# Patient Record
Sex: Female | Born: 1970 | Race: White | Hispanic: No | Marital: Married | Smoking: Never smoker
Health system: Southern US, Community
[De-identification: ages and names within clinical notes are randomized; demographics above are authoritative.]

## PROBLEM LIST (undated history)

## (undated) DIAGNOSIS — F32A Depression, unspecified: Secondary | ICD-10-CM

## (undated) DIAGNOSIS — E039 Hypothyroidism, unspecified: Secondary | ICD-10-CM

## (undated) DIAGNOSIS — F329 Major depressive disorder, single episode, unspecified: Secondary | ICD-10-CM

## (undated) DIAGNOSIS — F419 Anxiety disorder, unspecified: Secondary | ICD-10-CM

---

## 2008-06-20 ENCOUNTER — Emergency Department (HOSPITAL_BASED_OUTPATIENT_CLINIC_OR_DEPARTMENT_OTHER): Admission: EM | Admit: 2008-06-20 | Discharge: 2008-06-21 | Payer: Self-pay | Admitting: Emergency Medicine

## 2009-10-18 IMAGING — CR DG CHEST 2V
2 series · 2 of 2 positions shown · non-contrast
Comparison: None

CLINICAL DATA: Rapid heart palpitations tonight.  Brief episodes
for 1 week.  Shielded.

CHEST - 2 VIEW

[w chest pa]
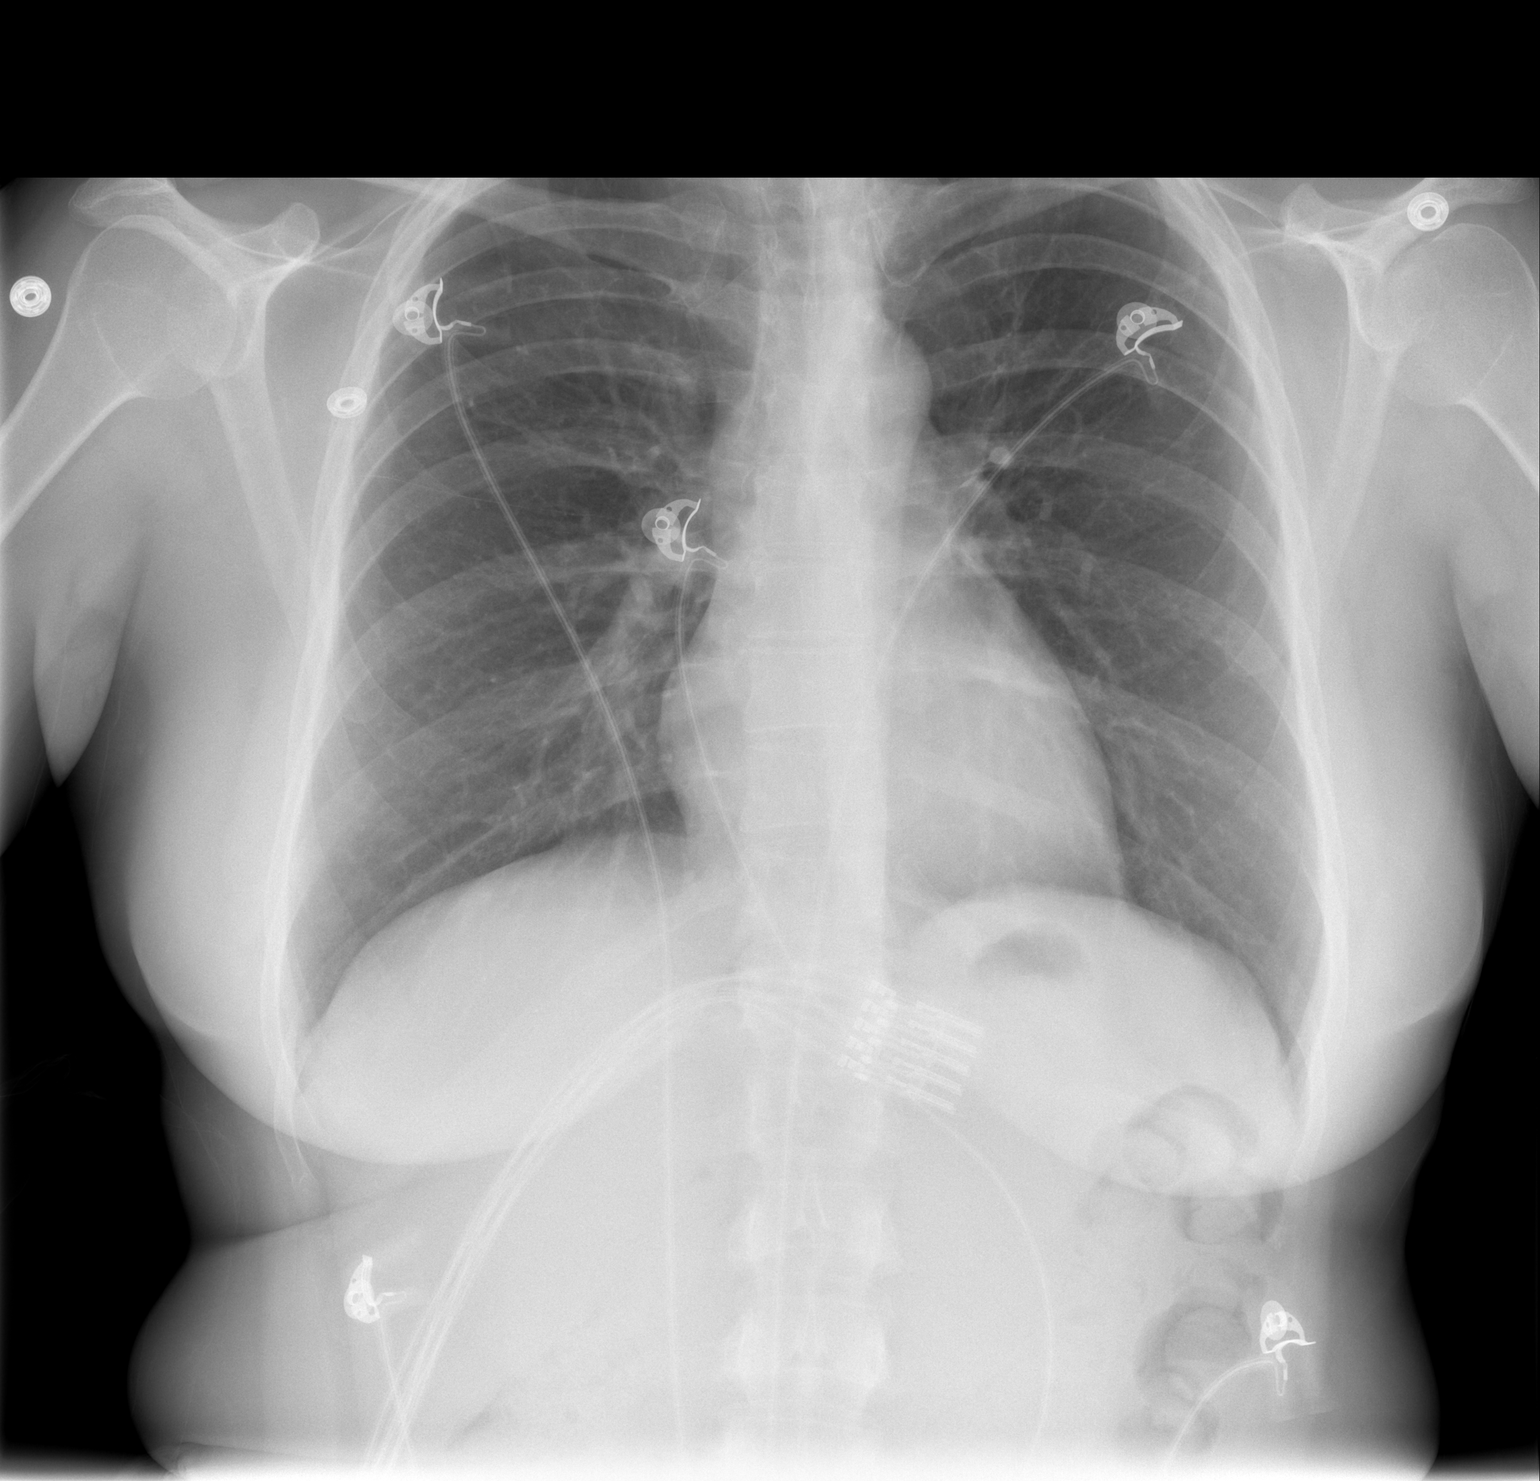

[w chest lat]
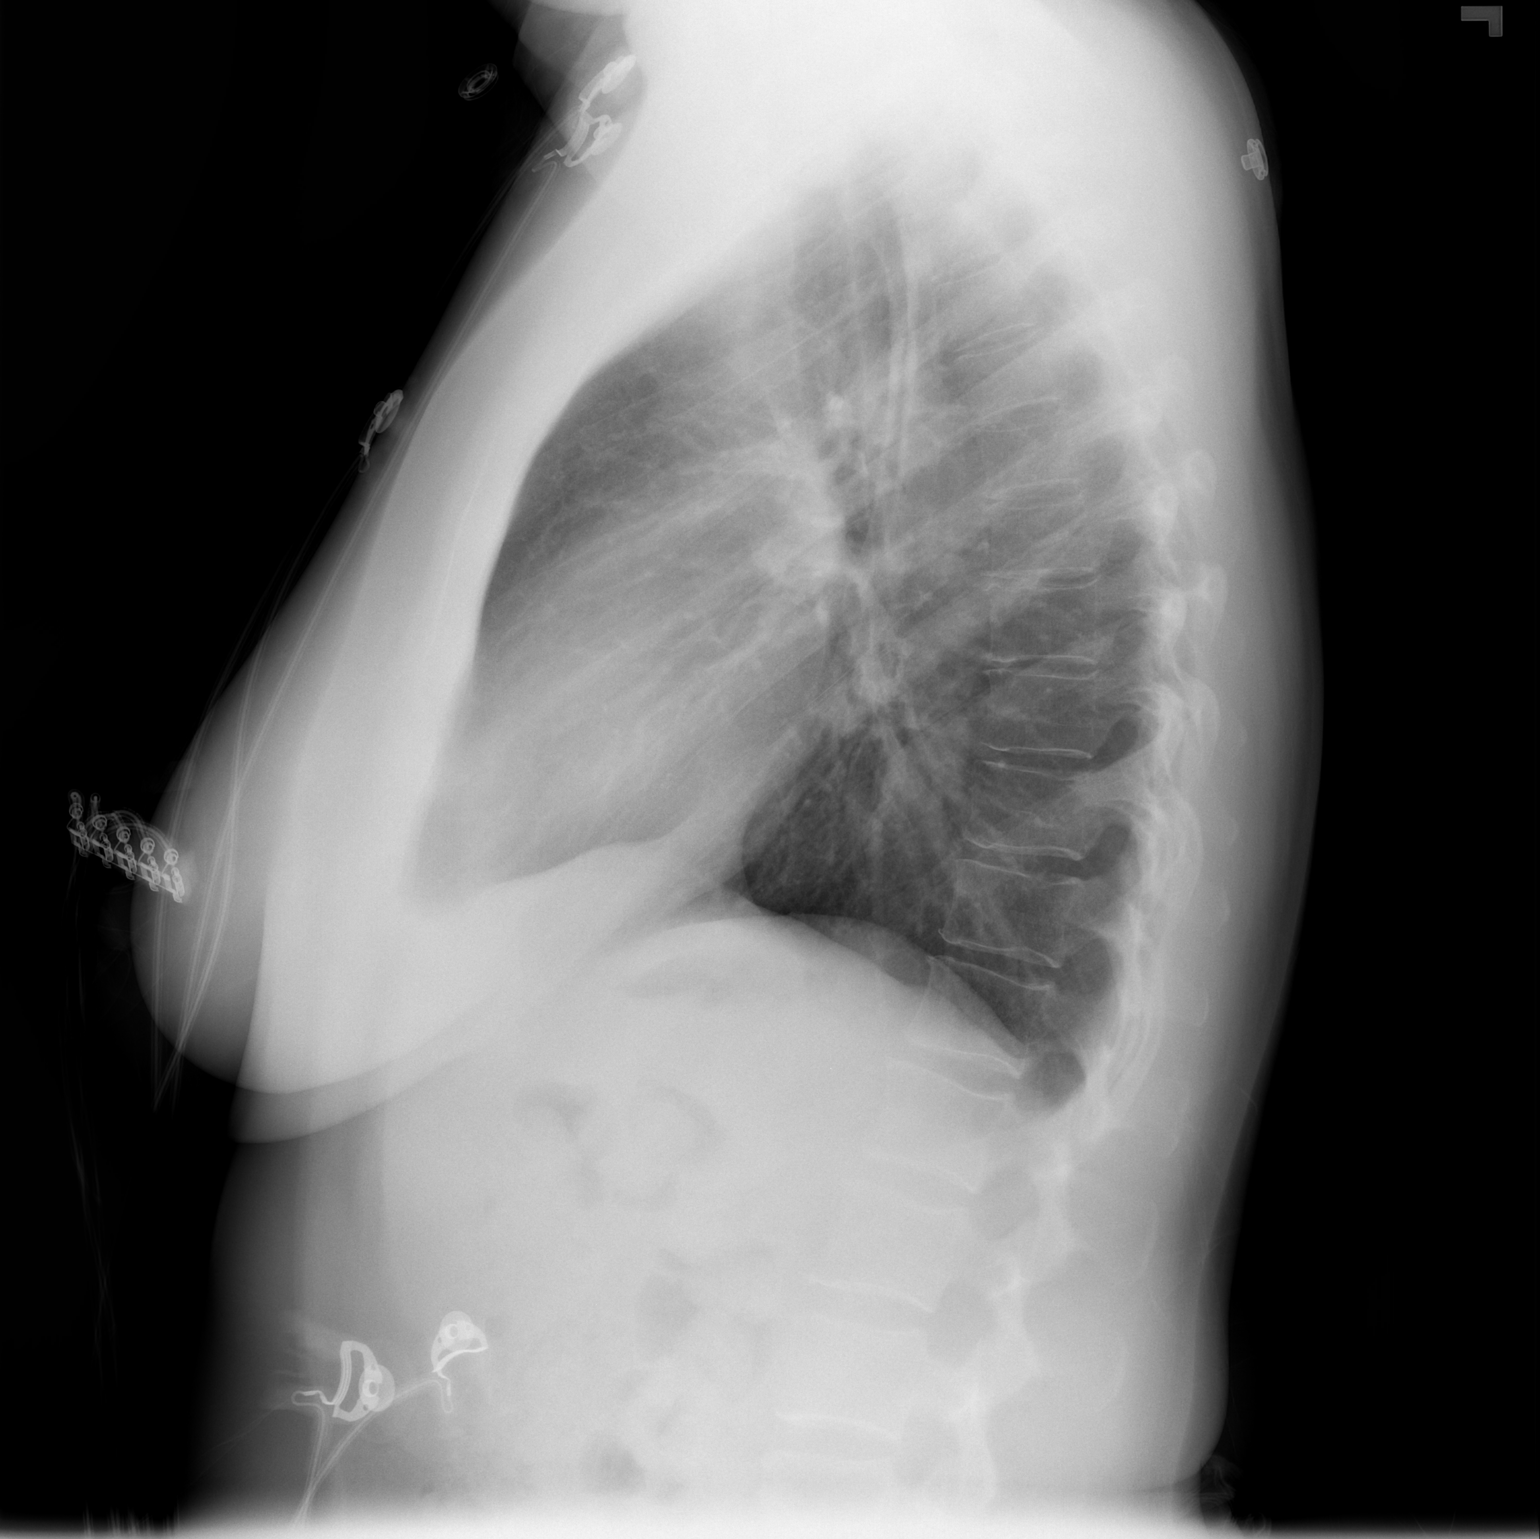

[2 of 2 positions shown; findings below may reference images not displayed]

FINDINGS: Cardiomediastinal silhouette is within normal limits.
The lungs are free of focal consolidations and pleural effusions.
Bony structures have a normal appearance.
IMPRESSION: No evidence for acute cardiopulmonary disease.

## 2011-06-22 LAB — BASIC METABOLIC PANEL
BUN: 18
CO2: 27
Calcium: 9
Chloride: 106
Creatinine, Ser: 1
GFR calc Af Amer: >60
GFR calc non Af Amer: >60
Glucose, Bld: 83
Potassium: 4
Sodium: 140

## 2011-06-22 LAB — POCT CARDIAC MARKERS
CKMB, poc: 1.9
Myoglobin, poc: 55.1
Troponin i, poc: <0.05

## 2011-06-22 LAB — PREGNANCY, URINE: Preg Test, Ur: NEGATIVE

## 2013-12-04 ENCOUNTER — Other Ambulatory Visit: Payer: Self-pay | Admitting: Family Medicine

## 2013-12-04 DIAGNOSIS — M542 Cervicalgia: Secondary | ICD-10-CM

## 2013-12-05 ENCOUNTER — Other Ambulatory Visit: Payer: Self-pay

## 2013-12-06 ENCOUNTER — Ambulatory Visit
Admission: RE | Admit: 2013-12-06 | Discharge: 2013-12-06 | Disposition: A | Discharge status: Home / Self Care | Payer: BC Managed Care – PPO | Source: Ambulatory Visit | Attending: Family Medicine | Admitting: Family Medicine

## 2013-12-06 DIAGNOSIS — M542 Cervicalgia: Secondary | ICD-10-CM

## 2014-12-03 ENCOUNTER — Other Ambulatory Visit: Payer: Self-pay | Admitting: Family Medicine

## 2014-12-03 DIAGNOSIS — E041 Nontoxic single thyroid nodule: Secondary | ICD-10-CM

## 2014-12-09 ENCOUNTER — Ambulatory Visit
Admission: RE | Admit: 2014-12-09 | Discharge: 2014-12-09 | Disposition: A | Discharge status: Home / Self Care | Payer: BLUE CROSS/BLUE SHIELD | Source: Ambulatory Visit | Attending: Family Medicine | Admitting: Family Medicine

## 2014-12-09 DIAGNOSIS — E041 Nontoxic single thyroid nodule: Secondary | ICD-10-CM

## 2017-03-11 ENCOUNTER — Emergency Department (HOSPITAL_BASED_OUTPATIENT_CLINIC_OR_DEPARTMENT_OTHER)
Admission: EM | Admit: 2017-03-11 | Discharge: 2017-03-11 | Disposition: A | Discharge status: Home / Self Care | Payer: BLUE CROSS/BLUE SHIELD | Attending: Emergency Medicine | Admitting: Emergency Medicine

## 2017-03-11 ENCOUNTER — Encounter (HOSPITAL_BASED_OUTPATIENT_CLINIC_OR_DEPARTMENT_OTHER): Payer: Self-pay | Admitting: Emergency Medicine

## 2017-03-11 ENCOUNTER — Emergency Department (HOSPITAL_BASED_OUTPATIENT_CLINIC_OR_DEPARTMENT_OTHER): Payer: BLUE CROSS/BLUE SHIELD

## 2017-03-11 DIAGNOSIS — R072 Precordial pain: Secondary | ICD-10-CM | POA: Diagnosis not present

## 2017-03-11 DIAGNOSIS — R079 Chest pain, unspecified: Secondary | ICD-10-CM | POA: Diagnosis present

## 2017-03-11 DIAGNOSIS — F419 Anxiety disorder, unspecified: Secondary | ICD-10-CM | POA: Diagnosis not present

## 2017-03-11 HISTORY — DX: Major depressive disorder, single episode, unspecified: F32.9

## 2017-03-11 HISTORY — DX: Depression, unspecified: F32.A

## 2017-03-11 HISTORY — DX: Anxiety disorder, unspecified: F41.9

## 2017-03-11 HISTORY — DX: Hypothyroidism, unspecified: E03.9

## 2017-03-11 LAB — CBC WITH DIFFERENTIAL/PLATELET
Basophils Absolute: 0 10*3/uL (ref 0.0–0.1)
Basophils Relative: 0 %
Eosinophils Absolute: 0.2 10*3/uL (ref 0.0–0.7)
Eosinophils Relative: 3 %
HCT: 37.5 % (ref 36.0–46.0)
Hemoglobin: 13.1 g/dL (ref 12.0–15.0)
Lymphocytes Relative: 46 %
Lymphs Abs: 2.4 10*3/uL (ref 0.7–4.0)
MCH: 28.1 pg (ref 26.0–34.0)
MCHC: 34.9 g/dL (ref 30.0–36.0)
MCV: 80.3 fL (ref 78.0–100.0)
Monocytes Absolute: 0.6 10*3/uL (ref 0.1–1.0)
Monocytes Relative: 11 %
Neutro Abs: 2.1 10*3/uL (ref 1.7–7.7)
Neutrophils Relative %: 40 %
Platelets: 245 10*3/uL (ref 150–400)
RBC: 4.67 MIL/uL (ref 3.87–5.11)
RDW: 12.7 % (ref 11.5–15.5)
WBC: 5.1 10*3/uL (ref 4.0–10.5)

## 2017-03-11 LAB — I-STAT CHEM 8, ED
BUN: 13 mg/dL (ref 6–20)
Calcium, Ion: 1.24 mmol/L (ref 1.15–1.40)
Chloride: 104 mmol/L (ref 101–111)
Creatinine, Ser: 0.5 mg/dL (ref 0.44–1.00)
Glucose, Bld: 91 mg/dL (ref 65–99)
HCT: 36 % (ref 36.0–46.0)
Hemoglobin: 12.2 g/dL (ref 12.0–15.0)
Potassium: 3.8 mmol/L (ref 3.5–5.1)
Sodium: 141 mmol/L (ref 135–145)
TCO2: 23 mmol/L (ref 0–100)

## 2017-03-11 LAB — D-DIMER, QUANTITATIVE: D-Dimer, Quant: <0.27 ug/mL-FEU (ref 0.00–0.50)

## 2017-03-11 LAB — TROPONIN I: Troponin I: <0.03 ng/mL (ref <0.03)

## 2017-03-11 MED ORDER — SODIUM CHLORIDE 0.9 % IV SOLN
INTRAVENOUS | Status: DC
  Administered 2017-03-11: 11:00:00 via INTRAVENOUS

## 2017-03-11 NOTE — ED Notes (Signed)
Pt c/o middle back pain currently. Ambulatory without assistance, in NAD, Helped into wheelchair.

## 2017-03-11 NOTE — Discharge Instructions (Signed)
Recommend starting a baby aspirin a day. Make an appointment to follow-up with cardiology. Return for any new or worse symptoms. Today's workup without any acute findings.

## 2017-03-11 NOTE — ED Provider Notes (Signed)
[facility]   CSN: [id number] Arrival date & time: 03/11/17  0909     History   Chief Complaint Chief Complaint  Patient presents with  . Chest Pain    HPI Jenna Hayden is a 46 y.o. female.   Patient with a complaint of intermittent chest pain mostly left side. Radiates to elbow and occasionally to both shoulders. We'll last greater than an hour at times. Been ongoing for 2 weeks. Patient has a history of anxiety and has had some increased anxiety. But normally does not get chest pain with the anxiety. Symptoms are associated with some shortness of breath and some nausea no vomiting no leg swelling. No history of any cardiac events. However does have a family history of premature cardiac disease with her father that had a heart attack prior to age 47.      Past Medical History:  Diagnosis Date  . Anxiety   . Depression   . Hypothyroid     There are no active problems to display for this patient.   History reviewed. No pertinent surgical history.  OB History    No data available       Home Medications    Prior to Admission medications   Not on File    Family History No family history on file.  Social History Social History  Substance Use Topics  . Smoking status: Never Smoker  . Smokeless tobacco: Never Used  . Alcohol use Yes     Comment: wine; sts not daily, but regularly     Allergies   Gluten meal   Review of Systems Review of Systems  Constitutional: Negative for fever.  HENT: Negative for congestion.   Eyes: Negative for visual disturbance.  Respiratory: Positive for shortness of breath.   Cardiovascular: Positive for chest pain. Negative for leg swelling.  Gastrointestinal: Positive for nausea. Negative for abdominal pain.  Genitourinary: Negative for dysuria.  Musculoskeletal: Negative for back pain.  Skin: Negative for rash.  Neurological: Negative for headaches.  Hematological: Does not bruise/bleed  easily.  Psychiatric/Behavioral: Negative for confusion.     Physical Exam Updated Vital Signs BP 128/78 (BP Location: Right Arm)   Pulse 80   Temp 99.2 F (37.3 C) (Oral)   Resp 16   Ht 1.6 m (5\' 3" )   Wt 67.1 kg (148 lb)   SpO2 100%   BMI 26.22 kg/m   Physical Exam  Constitutional: She is oriented to person, place, and time. She appears well-developed and well-nourished. No distress.  HENT:  Head: Normocephalic and atraumatic.  Mouth/Throat: Oropharynx is clear and moist.  Eyes: Conjunctivae and EOM are normal. Pupils are equal, round, and reactive to light.  Neck: Normal range of motion. Neck supple.  Cardiovascular: Normal rate and regular rhythm.   Pulmonary/Chest: Effort normal and breath sounds normal. No respiratory distress. She has no wheezes.  Abdominal: Soft. Bowel sounds are normal. There is no tenderness.  Musculoskeletal: Normal range of motion. She exhibits no edema.  Neurological: She is alert and oriented to person, place, and time. No cranial nerve deficit or sensory deficit. She exhibits normal muscle tone. Coordination normal.  Skin: Skin is warm.  Nursing note and vitals reviewed.    ED Treatments / Results  Labs (all labs ordered are listed, but only abnormal results are displayed) Labs Reviewed  CBC WITH DIFFERENTIAL/PLATELET  TROPONIN I  D-DIMER, QUANTITATIVE ([facility])  I-STAT CHEM 8, ED    EKG  EKG Interpretation  Date/Time:  Friday March 11 2017 09:29:26 EDT Ventricular Rate:  75 PR Interval:    QRS Duration: 86 QT Interval:  368 QTC Calculation: 411 R Axis:   -12 Text Interpretation:  Sinus rhythm Low voltage, precordial leads No significant change since last tracing Confirmed by Vanetta Mulders [phone]) on 03/11/2017 9:31:37 AM       Radiology Dg Chest 2 View  Result Date: 03/11/2017 CLINICAL DATA:  Chest pain for 3 days. EXAM: CHEST  2 VIEW COMPARISON:  Radiograph 06/20/2008 FINDINGS: Normal mediastinum and cardiac  silhouette. Normal pulmonary vasculature. No evidence of effusion, infiltrate, or pneumothorax. No acute bony abnormality. IMPRESSION: No acute cardiopulmonary process. Electronically Signed   By: Genevive Bi M.D.   On: 03/11/2017 10:40    Procedures Procedures (including critical care time)  Medications Ordered in ED Medications  0.9 %  sodium chloride infusion ( Intravenous New Bag/Given 03/11/17 1059)     Initial Impression / Assessment and Plan / ED Course  I have reviewed the triage vital signs and the nursing notes.  Pertinent labs & imaging results that were available during my care of the patient were reviewed by me and considered in my medical decision making (see chart for details).     Patient with 3 day history of chest discomfort and pain at times left-sided chest sometimes radiates to the left elbow of the times radiates to both shoulders. Patient has a history of anxiety and has been anxiety component with it patient usually does not have the symptoms causing chest pain.   Associated with some mild nausea and some shortness of breath. No history of any leg swelling.  Patient's workup here troponin negative d-dimer negative chest x-ray without any acute findings. EKG without any acute findings. Have recommended follow-up with cardiology will start a baby aspirin a day. Patient nontoxic no acute distress.  Final Clinical Impressions(s) / ED Diagnoses   Final diagnoses:  Precordial pain  Anxiety    New Prescriptions New Prescriptions   No medications on file     Vanetta Mulders, MD 03/11/17 1131

## 2017-03-11 NOTE — ED Triage Notes (Signed)
Pt reports recent anxiety, LT chest pressure w/ some radiation to back x 2-3 days; also reports dizziness x 2 wks

## 2017-03-12 ENCOUNTER — Encounter (HOSPITAL_BASED_OUTPATIENT_CLINIC_OR_DEPARTMENT_OTHER): Payer: Self-pay | Admitting: Emergency Medicine

## 2017-03-12 ENCOUNTER — Observation Stay (HOSPITAL_BASED_OUTPATIENT_CLINIC_OR_DEPARTMENT_OTHER)
Admission: EM | Admit: 2017-03-12 | Discharge: 2017-03-14 | Disposition: A | Discharge status: Home / Self Care | Payer: BLUE CROSS/BLUE SHIELD | Attending: Family Medicine | Admitting: Family Medicine

## 2017-03-12 DIAGNOSIS — F410 Panic disorder [episodic paroxysmal anxiety] without agoraphobia: Secondary | ICD-10-CM | POA: Diagnosis not present

## 2017-03-12 DIAGNOSIS — Z7982 Long term (current) use of aspirin: Secondary | ICD-10-CM | POA: Diagnosis not present

## 2017-03-12 DIAGNOSIS — Z6827 Body mass index (BMI) 27.0-27.9, adult: Secondary | ICD-10-CM | POA: Insufficient documentation

## 2017-03-12 DIAGNOSIS — Z8249 Family history of ischemic heart disease and other diseases of the circulatory system: Secondary | ICD-10-CM | POA: Diagnosis not present

## 2017-03-12 DIAGNOSIS — E669 Obesity, unspecified: Secondary | ICD-10-CM | POA: Insufficient documentation

## 2017-03-12 DIAGNOSIS — E039 Hypothyroidism, unspecified: Secondary | ICD-10-CM | POA: Diagnosis not present

## 2017-03-12 DIAGNOSIS — R001 Bradycardia, unspecified: Secondary | ICD-10-CM | POA: Insufficient documentation

## 2017-03-12 DIAGNOSIS — F419 Anxiety disorder, unspecified: Secondary | ICD-10-CM | POA: Diagnosis not present

## 2017-03-12 DIAGNOSIS — E785 Hyperlipidemia, unspecified: Secondary | ICD-10-CM | POA: Insufficient documentation

## 2017-03-12 DIAGNOSIS — R079 Chest pain, unspecified: Secondary | ICD-10-CM | POA: Diagnosis present

## 2017-03-12 DIAGNOSIS — Z79899 Other long term (current) drug therapy: Secondary | ICD-10-CM | POA: Diagnosis not present

## 2017-03-12 DIAGNOSIS — Z91018 Allergy to other foods: Secondary | ICD-10-CM | POA: Diagnosis not present

## 2017-03-12 DIAGNOSIS — R0789 Other chest pain: Secondary | ICD-10-CM | POA: Diagnosis not present

## 2017-03-12 DIAGNOSIS — F329 Major depressive disorder, single episode, unspecified: Secondary | ICD-10-CM | POA: Insufficient documentation

## 2017-03-12 DIAGNOSIS — F32A Depression, unspecified: Secondary | ICD-10-CM | POA: Diagnosis present

## 2017-03-12 LAB — BASIC METABOLIC PANEL
Anion gap: 8 (ref 5–15)
BUN: 14 mg/dL (ref 6–20)
CO2: 24 mmol/L (ref 22–32)
Calcium: 9 mg/dL (ref 8.9–10.3)
Chloride: 105 mmol/L (ref 101–111)
Creatinine, Ser: 0.69 mg/dL (ref 0.44–1.00)
GFR calc Af Amer: >60 mL/min (ref >60)
GFR calc non Af Amer: >60 mL/min (ref >60)
Glucose, Bld: 133 mg/dL — ABNORMAL HIGH (ref 65–99)
Potassium: 4.4 mmol/L (ref 3.5–5.1)
Sodium: 137 mmol/L (ref 135–145)

## 2017-03-12 LAB — CBC
HCT: 39.2 % (ref 36.0–46.0)
Hemoglobin: 13.8 g/dL (ref 12.0–15.0)
MCH: 28.8 pg (ref 26.0–34.0)
MCHC: 35.2 g/dL (ref 30.0–36.0)
MCV: 81.7 fL (ref 78.0–100.0)
Platelets: 273 10*3/uL (ref 150–400)
RBC: 4.8 MIL/uL (ref 3.87–5.11)
RDW: 12.8 % (ref 11.5–15.5)
WBC: 7.2 10*3/uL (ref 4.0–10.5)

## 2017-03-12 LAB — TROPONIN I: Troponin I: <0.03 ng/mL (ref <0.03)

## 2017-03-12 MED ORDER — THYROID 32.5 MG PO TABS
260.0000 mg | ORAL_TABLET | Freq: Every day | ORAL | Status: DC
  Administered 2017-03-13 (×2): 260 mg via ORAL
  Filled 2017-03-12 (×2): qty 8

## 2017-03-12 MED ORDER — GI COCKTAIL ~~LOC~~
30.0000 mL | Freq: Four times a day (QID) | ORAL | Status: DC | PRN
  Filled 2017-03-12: qty 30

## 2017-03-12 MED ORDER — ASPIRIN EC 325 MG PO TBEC
325.0000 mg | DELAYED_RELEASE_TABLET | Freq: Every day | ORAL | Status: DC
  Administered 2017-03-12 – 2017-03-14 (×3): 325 mg via ORAL
  Filled 2017-03-12 (×3): qty 1

## 2017-03-12 MED ORDER — ACETAMINOPHEN 325 MG PO TABS: 650.0000 mg | ORAL_TABLET | ORAL | Status: DC | PRN

## 2017-03-12 MED ORDER — ONDANSETRON HCL 4 MG/2ML IJ SOLN: 4.0000 mg | Freq: Four times a day (QID) | INTRAMUSCULAR | Status: DC | PRN

## 2017-03-12 MED ORDER — FLUOXETINE HCL 20 MG PO CAPS
20.0000 mg | ORAL_CAPSULE | Freq: Every day | ORAL | Status: DC
  Administered 2017-03-13 – 2017-03-14 (×2): 20 mg via ORAL
  Filled 2017-03-12 (×5): qty 1

## 2017-03-12 MED ORDER — NITROGLYCERIN 0.4 MG SL SUBL
0.4000 mg | SUBLINGUAL_TABLET | Freq: Once | SUBLINGUAL | Status: AC
  Administered 2017-03-12: 0.4 mg via SUBLINGUAL
  Filled 2017-03-12: qty 1

## 2017-03-12 NOTE — ED Notes (Signed)
RN at the bedside with patient during administration of NItro

## 2017-03-12 NOTE — ED Notes (Signed)
Patient denies any pain at this time.

## 2017-03-12 NOTE — ED Notes (Signed)
Patient awake and alert and oriented at this time talking with the RN  - Vitals being taken frequently

## 2017-03-12 NOTE — ED Provider Notes (Signed)
[facility]   CSN: [id number] Arrival date & time: 03/12/17  1326 By signing my name below, I, Jenna Hayden, attest that this documentation has been prepared under the direction and in the presence of Jenna Core, MD . Electronically Signed: Levon Hayden, Scribe. 03/12/2017. 4:16 PM.   History   Chief Complaint Chief Complaint  Patient presents with  . Chest Pain   HPI Jenna Hayden is a 46 y.o. female who presents to the Emergency Department complaining of intermittent, gradually worsening chest pain with occasional radiation to her left arm onset last week. She describes her chest pain as mild-to-moderate pressure, discomfort and soreness. Pain is somewhat exacerbated by exertion and ambulation. She reports associated anxiety, shortness of breath secondary to chest pain, dizziness, and nausea. Pt states she initially thought her dizziness was due to allergies and increased her allergy medication with mild relief. She left early from work 4 days ago due to severe anxiety. Per pt, she experienced some left-sided chest tightness that evening and felt "pounding" in her ears. She also reports some pain to her left elbow at that time. Her chest pain significantly worsened two nights ago; per pt, she intermittently felt soreness to her left back and left lateral abdomen which lasted 10 minutes at a time. She was evaluated at the ED yesterday for the same and was discharged home. Per pt, this does not feel like a typical anxiety attack.Pt has taken aspirin with significant relief of chest pain. Pt is a nonsmoker. No personal hx of HTN or cardiac disease. No hx of stress test; she did wear a cardiac monitor for several weeks which showed no arrhythmias.  Pt's father had a MI prior to the age of 24. Pt denies any vomiting has no other acute complaints or associated symptoms at this time.    PCP: Jenna Blamer, MD   The history is provided by the patient. No language  interpreter was used.    Past Medical History:  Diagnosis Date  . Anxiety   . Depression   . Hypothyroid     There are no active problems to display for this patient.   History reviewed. No pertinent surgical history.  OB History    No data available       Home Medications    Prior to Admission medications   Medication Sig Start Date End Date Taking? Authorizing Provider  FLUoxetine (PROZAC) 20 MG tablet Take 20 mg by mouth daily.   Yes [provider]    Family History No family history on file.  Social History Social History  Substance Use Topics  . Smoking status: Never Smoker  . Smokeless tobacco: Never Used  . Alcohol use Yes     Comment: wine; sts not daily, but regularly    Allergies   Gluten meal  Review of Systems Review of Systems  Respiratory: Positive for shortness of breath.   Cardiovascular: Positive for chest pain.  Gastrointestinal: Positive for abdominal pain and nausea. Negative for vomiting.  Musculoskeletal: Positive for back pain.  Neurological: Positive for dizziness.  Psychiatric/Behavioral: The patient is nervous/anxious.    Physical Exam Updated Vital Signs BP 124/82 (BP Location: Left Arm)   Pulse 62   Temp 98.3 F (36.8 C) (Oral)   Resp 18   SpO2 99%   Physical Exam  Constitutional: She is oriented to person, place, and time. She appears well-developed and well-nourished. No distress.  HENT:  Head: Normocephalic and atraumatic.  Eyes: Conjunctivae are  normal.  Cardiovascular: Normal rate.   Pulmonary/Chest: Effort normal.  Abdominal: She exhibits no distension.  Neurological: She is alert and oriented to person, place, and time.  Skin: Skin is warm and dry.  Psychiatric: Her mood appears anxious.  Nursing note and vitals reviewed.  ED Treatments / Results  DIAGNOSTIC STUDIES:  Oxygen Saturation is 99% on RA, normal by my interpretation.    COORDINATION OF CARE:  4:15 PM Discussed treatment plan with  pt at bedside and pt agreed to plan.   Labs (all labs ordered are listed, but only abnormal results are displayed) Labs Reviewed  BASIC METABOLIC PANEL - Abnormal; Notable for the following:       Result Value   Glucose, Bld 133 (*)    All other components within normal limits  CBC  TROPONIN I    EKG  EKG Interpretation  Date/Time:  Saturday March 12 2017 13:38:30 EDT Ventricular Rate:  88 PR Interval:  118 QRS Duration: 72 QT Interval:  350 QTC Calculation: 423 R Axis:   27 Text Interpretation:  Normal sinus rhythm Normal ECG Confirmed by Rubin PayorPICKERING  MD, NATHAN [phone]) on 03/12/2017 3:56:02 PM      Radiology Dg Chest 2 View  Result Date: 03/11/2017 CLINICAL DATA:  Chest pain for 3 days. EXAM: CHEST  2 VIEW COMPARISON:  Radiograph 06/20/2008 FINDINGS: Normal mediastinum and cardiac silhouette. Normal pulmonary vasculature. No evidence of effusion, infiltrate, or pneumothorax. No acute bony abnormality. IMPRESSION: No acute cardiopulmonary process. Electronically Signed   By: Jenna Hayden  Edmunds M.D.   On: 03/11/2017 10:40    Procedures Procedures (including critical care time)  Medications Ordered in ED Medications  nitroGLYCERIN (NITROSTAT) SL tablet 0.4 mg (not administered)     Initial Impression / Assessment and Plan / ED Course  I have reviewed the triage vital signs and the nursing notes.  Pertinent labs & imaging results that were available during my care of the patient were reviewed by me and considered in my medical decision making (see chart for details).   patient with chest pain. Dull. His had it for the last few days. Gets worse when she gets stressed out. However may also get worse with exertion. Does have a history of anxiety witch [place] [facility] has a component to this. EKG reassuring. Seen yesterday for same. Has trouble is negative yesterday and today. Negative d-dimer yesterday. However her father had his first heart attack under 47 years old at around her age.  States she is 100% sure this is not her anxiety because she knows her body. She is overall low risk that there is questionable exertional component that could just be due to her stress. Will admit to internal medicine.  Final Clinical Impressions(s) / ED Diagnoses   Final diagnoses:  Chest pain, unspecified type    New Prescriptions New Prescriptions   No medications on file    I personally performed the services described in this documentation, which was scribed in my presence. The recorded information has been reviewed and is accurate.      Jenna CorePickering, Nathan, MD 03/12/17 [phone]

## 2017-03-12 NOTE — ED Notes (Signed)
Patient states that she had no pain, and now it is coming back " twinges here and there"

## 2017-03-12 NOTE — Progress Notes (Signed)
   03/12/17 2047  Vitals  Temp 98.2 F (36.8 C)  Temp Source Oral  BP 122/64  BP Location Left Leg  BP Method Automatic  Patient Position (if appropriate) Lying  Pulse Rate 69  Pulse Rate Source Dinamap  Resp 18  Oxygen Therapy  SpO2 97 %  O2 Device Room Air  Pain Assessment  Pain Assessment No/denies pain  Height and Weight  Height 5\' 3"  (1.6 m)  Weight 70.3 kg (154 lb 14.4 oz)  Type of Scale Used Bed  Type of Weight Actual  BSA (Calculated - sq m) 1.77 sq meters  BMI (Calculated) 27.5  Weight in (lb) to have BMI = 25 140.8   [facility] VSS. Currently denies chest pain. Cardiac monitor applied and verified. Pt educated regarding room, unit and call bell. Will monitor.

## 2017-03-12 NOTE — ED Triage Notes (Signed)
Intermittent chest pain since yesterday. Seen here yesterday for same and discharged.

## 2017-03-12 NOTE — ED Notes (Signed)
MD aware of the chest pain, no new orders.

## 2017-03-12 NOTE — H&P (Signed)
History and Physical    Jenna Hayden WUJ:[id number] DOB: 03-May-1971 DOA: 03/12/2017  PCP: Jenna Blamer, MD  Patient coming from:  home  Chief Complaint:  Chest pressure  HPI: Jenna Hayden is Hayden 46 y.o. female with medical history significant of axiety, panic attacks, depression, nonsmoker comes in with over 4 days of chest pressure that is constant and sometimes gets worse when her anxiety increases.  Pt reports that with her normal anxiety attacks she gets pain sensations in her chest but this is different, normally her pain wraps around her, but this is more centralized from the front to the back and down her left arm and up into her anterior neck.  Nothing makes it better or worse except she did take an aspirin for it last night which improved it enough to allow her to sleep.  She denies any fever or cough.  She does have associated sob with it sometimes.  No leg or calf pain.  No hemoptysis.  pts father died in his 51s of Hayden heart attack.  She has never had any cardiac work up in the past.  This is her second urgent care visit today, and was therefore referred by [facility] for admission for possible ACS.     Review of Systems: As per HPI otherwise 10 point review of systems negative.   Past Medical History:  Diagnosis Date  . Anxiety   . Depression   . Hypothyroid     History reviewed. No pertinent surgical history.   reports that she has never smoked. She has never used smokeless tobacco. She reports that she drinks alcohol. She reports that she does not use drugs.  Allergies  Allergen Reactions  . Gluten Meal     "Corn gluten"    No family history on file.  Prior to Admission medications   Medication Sig Start Date End Date Taking? Authorizing Provider  FLUoxetine (PROZAC) 20 MG tablet Take 20 mg by mouth daily.   Yes [provider]    Physical Exam: Vitals:   03/12/17 1700 03/12/17 1715 03/12/17 1935 03/12/17 2047  BP: 131/76 113/71 121/81 122/64  Pulse: 62 67 76  69  Resp: 13 13 18 18   Temp:    98.2 F (36.8 C)  TempSrc:    Oral  SpO2: 99% 100% 100% 97%  Weight:    70.3 kg (154 lb 14.4 oz)  Height:    5\' 3"  (1.6 m)    Constitutional: NAD, calm, comfortable Vitals:   03/12/17 1700 03/12/17 1715 03/12/17 1935 03/12/17 2047  BP: 131/76 113/71 121/81 122/64  Pulse: 62 67 76 69  Resp: 13 13 18 18   Temp:    98.2 F (36.8 C)  TempSrc:    Oral  SpO2: 99% 100% 100% 97%  Weight:    70.3 kg (154 lb 14.4 oz)  Height:    5\' 3"  (1.6 m)   Eyes: PERRL, lids and conjunctivae normal ENMT: Mucous membranes are moist. Posterior pharynx clear of any exudate or lesions.Normal dentition.  Neck: normal, supple, no masses, no thyromegaly Respiratory: clear to auscultation bilaterally, no wheezing, no crackles. Normal respiratory effort. No accessory muscle use.  Cardiovascular: Regular rate and rhythm, no murmurs / rubs / gallops. No extremity edema. 2+ pedal pulses. No carotid bruits.  Abdomen: no tenderness, no masses palpated. No hepatosplenomegaly. Bowel sounds positive.  Musculoskeletal: no clubbing / cyanosis. No joint deformity upper and lower extremities. Good ROM, no contractures. Normal muscle tone.  Skin: no rashes, lesions, ulcers.  No induration Neurologic: CN 2-12 grossly intact. Sensation intact, DTR normal. Strength 5/5 in all 4.  Psychiatric: Normal judgment and insight. Alert and oriented x 3. Normal mood.    Labs on Admission: I have personally reviewed following labs and imaging studies  CBC:  Recent Labs Lab 03/11/17 0944 03/11/17 1007 03/12/17 1538  WBC 5.1  --  7.2  NEUTROABS 2.1  --   --   HGB 13.1 12.2 13.8  HCT 37.5 36.0 39.2  MCV 80.3  --  81.7  PLT 245  --  273   Basic Metabolic Panel:  Recent Labs Lab 03/11/17 1007 03/12/17 1538  NA 141 137  K 3.8 4.4  CL 104 105  CO2  --  24  GLUCOSE 91 133*  BUN 13 14  CREATININE 0.50 0.69  CALCIUM  --  9.0   GFR: Estimated Creatinine Clearance: 81.8 mL/min (by C-G  formula based on SCr of 0.69 mg/dL).  Cardiac Enzymes:  Recent Labs Lab 03/11/17 0944 03/12/17 1538  TROPONINI <0.03 <0.03    Radiological Exams on Admission: Dg Chest 2 View  Result Date: 03/11/2017 CLINICAL DATA:  Chest pain for 3 days. EXAM: CHEST  2 VIEW COMPARISON:  Radiograph 06/20/2008 FINDINGS: Normal mediastinum and cardiac silhouette. Normal pulmonary vasculature. No evidence of effusion, infiltrate, or pneumothorax. No acute bony abnormality. IMPRESSION: No acute cardiopulmonary process. Electronically Signed   By: Genevive BiStewart  Edmunds M.D.   On: 03/11/2017 10:40    EKG: Independently reviewed. nsr no acute changes cxr reviwed no edema or infiltrate   Assessment/Plan 46 yo female with chest pain for 4 days  Principal Problem:   Chest pain- romi.  Cardiac echo in am.  Aspirin.  Consider outpt stress testing.  Active Problems:   Anxiety- noted   Depression- noted    DVT prophylaxis:  scds Code Status:  full Family Communication:  none  Disposition Plan:  Per day team Consults called:  none Admission status:  observation   Jenna,RACHAL A MD [facility]  If 7PM-7AM, please contact night-coverage [email or web address] Password [email or web address]  03/12/2017, 9:36 PM

## 2017-03-13 ENCOUNTER — Observation Stay (HOSPITAL_COMMUNITY): Payer: BLUE CROSS/BLUE SHIELD

## 2017-03-13 DIAGNOSIS — R079 Chest pain, unspecified: Secondary | ICD-10-CM

## 2017-03-13 DIAGNOSIS — F329 Major depressive disorder, single episode, unspecified: Secondary | ICD-10-CM | POA: Diagnosis not present

## 2017-03-13 DIAGNOSIS — F419 Anxiety disorder, unspecified: Secondary | ICD-10-CM | POA: Diagnosis not present

## 2017-03-13 DIAGNOSIS — F410 Panic disorder [episodic paroxysmal anxiety] without agoraphobia: Secondary | ICD-10-CM | POA: Diagnosis not present

## 2017-03-13 DIAGNOSIS — R0789 Other chest pain: Secondary | ICD-10-CM | POA: Diagnosis not present

## 2017-03-13 LAB — LIPID PANEL
Cholesterol: 200 mg/dL (ref 0–200)
HDL: 34 mg/dL — ABNORMAL LOW (ref >40)
LDL Cholesterol: 138 mg/dL — ABNORMAL HIGH (ref 0–99)
Total CHOL/HDL Ratio: 5.9 RATIO
Triglycerides: 138 mg/dL (ref <150)
VLDL: 28 mg/dL (ref 0–40)

## 2017-03-13 LAB — ECHOCARDIOGRAM COMPLETE
E decel time: 201 msec
E/e' ratio: 5.12
FS: 32 % (ref 28–44)
Height: 63 in
IVS/LV PW RATIO, ED: 0.88
LA ID, A-P, ES: 39 mm
LA diam end sys: 39 mm
LA diam index: 2.25 cm/m2
LA vol A4C: 35.9 ml
LA vol index: 23.6 mL/m2
LA vol: 40.8 mL
LV E/e' medial: 5.12
LV E/e'average: 5.12
LV PW d: 10.7 mm — AB (ref 0.6–1.1)
LV e' LATERAL: 14.4 cm/s
LVOT SV: 75 mL
LVOT VTI: 21.7 cm
LVOT area: 3.46 cm2
LVOT diameter: 21 mm
LVOT peak grad rest: 4 mmHg
LVOT peak vel: 98.6 cm/s
Lateral S' vel: 15.4 cm/s
MV Dec: 201
MV Peak grad: 2 mmHg
MV pk A vel: 66 m/s
MV pk E vel: 73.7 m/s
RV sys press: 35 mmHg
Reg peak vel: 282 cm/s
TAPSE: 26.5 mm
TDI e' lateral: 14.4
TDI e' medial: 14.6
TR max vel: 282 cm/s
Weight: 2478.4 oz

## 2017-03-13 MED ORDER — FLUOXETINE HCL 40 MG PO CAPS: 40.0000 mg | ORAL_CAPSULE | Freq: Every day | ORAL | Status: DC

## 2017-03-13 MED ORDER — THYROID 30 MG PO TABS: 32.5000 mg | ORAL_TABLET | Freq: Every day | ORAL | Status: DC

## 2017-03-13 MED ORDER — NITROGLYCERIN 0.4 MG SL SUBL
SUBLINGUAL_TABLET | SUBLINGUAL | Status: AC
  Administered 2017-03-13: 0.4 mg
  Filled 2017-03-13: qty 1

## 2017-03-13 MED ORDER — LORATADINE 10 MG PO TABS
10.0000 mg | ORAL_TABLET | Freq: Every day | ORAL | Status: DC
  Administered 2017-03-13 – 2017-03-14 (×2): 10 mg via ORAL
  Filled 2017-03-13 (×2): qty 1

## 2017-03-13 NOTE — Progress Notes (Signed)
  Echocardiogram 2D Echocardiogram has been performed.  Delcie RochENNINGTON, LAUREN 03/13/2017, 10:56 AM

## 2017-03-13 NOTE — Consult Note (Signed)
Reason for Consult: Recurrent chest pain Referring Physician: [facility]  Jenna Hayden is an 46 y.o. female.  HPI: Patient is 46 year old female with past medical history significant for anxiety disorder, depression, hypothyroidism, and strong family history of coronary artery disease father had MI 2 in his 80s came to the ER complaining of left-sided chest pain described as tightness off and on for last few days radiating occasionally to back and left arm took one aspirin at home with partial relief of chest pain workup on Friday was negative was discharged home and again developed left-sided chest pressure off and on so decided to come to ED. EKG done in the ED showed no acute ischemic changes cardiac enzymes so far has been negative. Patient denies any relation of chest pain to food breathing or movement. Denies any cardiac workup in the past. Patient states her blood pressure stays in low normal range.   Past Medical History:  Diagnosis Date  . Anxiety   . Depression   . Hypothyroid     History reviewed. No pertinent surgical history.  No family history on file.  Social History:  reports that she has never smoked. She has never used smokeless tobacco. She reports that she drinks alcohol. She reports that she does not use drugs.  Allergies:  Allergies  Allergen Reactions  . Corn-Containing Products Other (See Comments)    Cornstarch causes heartburn, swollen & weepy eyes, cystic acne, corn syrup causes diarrhea,   . Gluten Meal Rash and Other (See Comments)    Constipation, hot flashes, weight gain    Medications: I have reviewed the patient's current medications.  Results for orders placed or performed during the hospital encounter of 03/12/17 (from the past 48 hour(s))  Basic metabolic panel     Status: Abnormal   Collection Time: 03/12/17  3:38 PM  Result Value Ref Range   Sodium 137 135 - 145 mmol/L   Potassium 4.4 3.5 - 5.1 mmol/L   Chloride 105 101 - 111 mmol/L    CO2 24 22 - 32 mmol/L   Glucose, Bld 133 (H) 65 - 99 mg/dL   BUN 14 6 - 20 mg/dL   Creatinine, Ser 0.69 0.44 - 1.00 mg/dL   Calcium 9.0 8.9 - 10.3 mg/dL   GFR calc non Af Amer >60 >60 mL/min   GFR calc Af Amer >60 >60 mL/min    Comment: (NOTE) The eGFR has been calculated using the CKD EPI equation. This calculation has not been validated in all clinical situations. eGFR's persistently <60 mL/min signify possible Chronic Kidney Disease.    Anion gap 8 5 - 15  CBC     Status: None   Collection Time: 03/12/17  3:38 PM  Result Value Ref Range   WBC 7.2 4.0 - 10.5 K/uL   RBC 4.80 3.87 - 5.11 MIL/uL   Hemoglobin 13.8 12.0 - 15.0 g/dL   HCT 39.2 36.0 - 46.0 %   MCV 81.7 78.0 - 100.0 fL   MCH 28.8 26.0 - 34.0 pg   MCHC 35.2 30.0 - 36.0 g/dL   RDW 12.8 11.5 - 15.5 %   Platelets 273 150 - 400 K/uL  Troponin I     Status: None   Collection Time: 03/12/17  3:38 PM  Result Value Ref Range   Troponin I <0.03 <0.03 ng/mL    Dg Chest 2 View  Result Date: 03/11/2017 CLINICAL DATA:  Chest pain for 3 days. EXAM: CHEST  2 VIEW COMPARISON:  Radiograph 06/20/2008  FINDINGS: Normal mediastinum and cardiac silhouette. Normal pulmonary vasculature. No evidence of effusion, infiltrate, or pneumothorax. No acute bony abnormality. IMPRESSION: No acute cardiopulmonary process. Electronically Signed   By: Suzy Bouchard M.D.   On: 03/11/2017 10:40    Review of Systems  Constitutional: Negative for chills, diaphoresis and fever.  HENT: Negative for hearing loss.   Respiratory: Negative for cough and hemoptysis.   Cardiovascular: Positive for chest pain and palpitations. Negative for claudication and leg swelling.  Gastrointestinal: Negative for abdominal pain and heartburn.  Neurological: Negative for dizziness.   Blood pressure (!) 124/52, pulse (!) 50, temperature 97.6 F (36.4 C), temperature source Oral, resp. rate 16, height _0  (1.6 m), weight 70.3 kg (154 lb 14.4 oz), SpO2 99 %. Physical  Exam  Constitutional: She is oriented to person, place, and time.  HENT:  Head: Normocephalic and atraumatic.  Eyes: Conjunctivae are normal. Pupils are equal, round, and reactive to light. Left eye exhibits no discharge. No scleral icterus.  Neck: Normal range of motion. Neck supple. No JVD present. No tracheal deviation present. No thyromegaly present.  Cardiovascular: Normal rate and regular rhythm.   Murmur (Soft systolic murmur noted no S3 gallop) heard. Respiratory: Effort normal and breath sounds normal. No respiratory distress. She has no wheezes. She has no rales.  GI: Soft. Bowel sounds are normal. She exhibits no distension. There is no tenderness. There is no rebound and no guarding.  Musculoskeletal: She exhibits no edema, tenderness or deformity.  Neurological: She is alert and oriented to person, place, and time.    Assessment/Plan: Atypical chest pain with some features worrisome for angina rule out coronary insufficiency Elevated blood sugar Hypothyroidism Anxiety disorder Depression Strong family history of coronary artery disease Plan Check serial enzymes and EKG Check lipid panel/hemoglobin A1c Schedule for stress Myoview in a.m. Nitrostat sublingual when necessary Will hold off beta blockers/calcium channel blockers in view of bradycardia and low blood pressure [name] 03/13/2017, 10:16 AM

## 2017-03-13 NOTE — Progress Notes (Signed)
PROGRESS NOTE    Jenna Hayden  ZOX:[id number] DOB: 01/11/1971 DOA: 03/12/2017 PCP: Johny BlamerHarris, William, MD  Outpatient Specialists:     Brief Narrative:  7247 ? Bipolar on fluoxetine Panic attacks  Seen at ED recently and sent home for panic attack returned 6/23 to [facility] with chest pain and similar panic attack but different symptomatology with breast discomfort Scheduled for stress test a.m.     Assessment & Plan:   Principal Problem:   Chest pain Active Problems:   Anxiety   Depression   Chest pain, d-dimer negative so low probability of PE  Heart score about 3 given positives for family history, symptomatology  Note that troponin is negative and EKG is completely normal  Consulted cardiologist Dr.harwani who will stress her tomorrow  Follow echocardiogram ordered  Risk factor stratification with lipids  Anxiety and panic attack situational  Has been on fluoxetine for close to 20 years, will need possibly Ativan on discharge  Should have a caffeine audit, can continue relaxation exercises  Mild obesity, Body mass index is 27.44 kg/m.   Outpatient management, obtain A1c lipid panel  Mild bradycardia hypotension  Monitor on telemetry  Hypothyroidism  Check TSH as an outpatient  SCDs OBSERVATION Await stress test Discussed with friend at bedside   Consultants:   Cardiology for harwani  Procedures:   Stress test is pending  Antimicrobials:   None    Subjective: Relates long history of chest pain and recent events which started on Tuesday with stress at work as well as building anxiety and relates ED visits and hospitalization Tells me she wants to stay for stress test   Objective: Vitals:   03/12/17 1935 03/12/17 2047 03/13/17 0008 03/13/17 0541  BP: 121/81 122/64 (!) 118/56 (!) 124/52  Pulse: 76 69 60 (!) 50  Resp: 18 18 16 16   Temp:  98.2 F (36.8 C) 97.9 F (36.6 C) 97.6 F (36.4 C)  TempSrc:  Oral Oral Oral  SpO2: 100% 97%  98% 99%  Weight:  70.3 kg (154 lb 14.4 oz)    Height:  5\' 3"  (1.6 m)     No intake or output data in the 24 hours ending 03/13/17 1152 Filed Weights   03/12/17 2047  Weight: 70.3 kg (154 lb 14.4 oz)    Examination:  Pleasant anxious oriented No pallor no exudate( Chest clear no added sound no costochondral tenderness S1-S2 ? Holosystolic murmur Abdomen soft nontender nondistended Intact neurologically   Data Reviewed: I have personally reviewed following labs and imaging studies  CBC:  Recent Labs Lab 03/11/17 0944 03/11/17 1007 03/12/17 1538  WBC 5.1  --  7.2  NEUTROABS 2.1  --   --   HGB 13.1 12.2 13.8  HCT 37.5 36.0 39.2  MCV 80.3  --  81.7  PLT 245  --  273   Basic Metabolic Panel:  Recent Labs Lab 03/11/17 1007 03/12/17 1538  NA 141 137  K 3.8 4.4  CL 104 105  CO2  --  24  GLUCOSE 91 133*  BUN 13 14  CREATININE 0.50 0.69  CALCIUM  --  9.0   GFR: Estimated Creatinine Clearance: 81.8 mL/min (by C-G formula based on SCr of 0.69 mg/dL). Liver Function Tests: No results for input(s): AST, ALT, ALKPHOS, BILITOT, PROT, ALBUMIN in the last 168 hours. No results for input(s): LIPASE, AMYLASE in the last 168 hours. No results for input(s): AMMONIA in the last 168 hours. Coagulation Profile: No results for input(s):  INR, PROTIME in the last 168 hours. Cardiac Enzymes:  Recent Labs Lab 03/11/17 0944 03/12/17 1538  TROPONINI <0.03 <0.03   BNP (last 3 results) No results for input(s): PROBNP in the last 8760 hours. HbA1C: No results for input(s): HGBA1C in the last 72 hours. CBG: No results for input(s): GLUCAP in the last 168 hours. Lipid Profile: No results for input(s): CHOL, HDL, LDLCALC, TRIG, CHOLHDL, LDLDIRECT in the last 72 hours. Thyroid Function Tests: No results for input(s): TSH, T4TOTAL, FREET4, T3FREE, THYROIDAB in the last 72 hours. Anemia Panel: No results for input(s): VITAMINB12, FOLATE, FERRITIN, TIBC, IRON, RETICCTPCT in the  last 72 hours. Urine analysis: No results found for: COLORURINE, APPEARANCEUR, LABSPEC, PHURINE, GLUCOSEU, HGBUR, BILIRUBINUR, KETONESUR, PROTEINUR, UROBILINOGEN, NITRITE, LEUKOCYTESUR Sepsis Labs: @LABRCNTIP (procalcitonin:4,lacticidven:4)  )No results found for this or any previous visit (from the past 240 hour(s)).       Radiology Studies: No results found.      Scheduled Meds: . aspirin EC  325 mg Oral Daily  . FLUoxetine  20 mg Oral Daily  . thyroid  260 mg Oral QHS   Continuous Infusions:   LOS: 0 days    Time spent: 35  I spent 20 minutes face-to-face time discussing with the patient plan of care    Pleas Koch, MD [facility] ([phone]   If 7PM-7AM, please contact night-coverage [email or web address] Password [facility] 03/13/2017, 11:52 AM

## 2017-03-14 ENCOUNTER — Observation Stay (HOSPITAL_COMMUNITY): Payer: BLUE CROSS/BLUE SHIELD

## 2017-03-14 DIAGNOSIS — R079 Chest pain, unspecified: Secondary | ICD-10-CM | POA: Diagnosis not present

## 2017-03-14 LAB — HEMOGLOBIN A1C
Hgb A1c MFr Bld: 5.3 % (ref 4.8–5.6)
Mean Plasma Glucose: 105 mg/dL

## 2017-03-14 MED ORDER — ATORVASTATIN CALCIUM 20 MG PO TABS
20.0000 mg | ORAL_TABLET | Freq: Every day | ORAL | 1 refills | Status: DC
Start: 2017-03-14 — End: 2022-11-16

## 2017-03-14 MED ORDER — TECHNETIUM TC 99M TETROFOSMIN IV KIT
30.0000 | PACK | Freq: Once | INTRAVENOUS | Status: AC | PRN
  Administered 2017-03-14: 30 via INTRAVENOUS

## 2017-03-14 MED ORDER — LORAZEPAM 0.5 MG PO TABS: 0.5000 mg | ORAL_TABLET | Freq: Two times a day (BID) | ORAL | 0 refills | Status: AC

## 2017-03-14 MED ORDER — TECHNETIUM TC 99M TETROFOSMIN IV KIT
10.0000 | PACK | Freq: Once | INTRAVENOUS | Status: AC | PRN
  Administered 2017-03-14: 10 via INTRAVENOUS

## 2017-03-14 NOTE — Discharge Summary (Addendum)
Physician Discharge Summary  Jenna Hayden ZOX:[id number] DOB: Jul 06, 1971 DOA: 03/12/2017  PCP: Johny Blamer, MD  Admit date: 03/12/2017 Discharge date: 03/14/2017  Time spent: 35 minutes  Recommendations for Outpatient Follow-up:  1. Patient to be evaluated by primary care physician for noncardiac causes of chest pain, suspect patient has severe anxiety as well as situational stressors at work that are acting as precipitants for this. I would recommend psychology follow-up in cognitive behavioral therapy in addition to low-dose Ativan which have described this admission 2. She should follow-up with her cardiologist from the hospital in about 2 weeks 3. New meds this admit-Atorvastatin 20 od, Ativan 0.5 bid prn  Discharge Diagnoses:  Principal Problem:   Chest pain Active Problems:   Anxiety   Depression   Discharge Condition: Improved  Diet recommendation: Heart healthy low-salt  Filed Weights   03/12/17 2047  Weight: 70.3 kg (154 lb 14.4 oz)    History of present illness:   46 ? Bipolar on fluoxetine Panic attacks  Seen at ED recently and sent home for panic attack returned 6/23 to [facility] [place] [facility] with chest pain and similar panic attack but different symptomatology with breast discomfort See below   Hospital Course:  Chest pain, d-dimer negative so low probability of PE             Heart score about 3 given positives for family history, symptomatology             Note that troponin is negative and EKG is completely normal             Consulted cardiologist Dr.harwani  Stress test performed 6.25.18 = normal without any signs of ischemia              echocardiogram EF 60-65% without diastolic dysfunction but elevated PSV              LDL cholesterol slightly elevated at 138-cardiologist added statin  Anxiety and panic attack situational             Has been on fluoxetine for close to 20 years,, prescribed Ativan 0.5 3 times a day when necessary              Should have a caffeine audit, can continue relaxation exercises  Mild obesity, Body mass index is 27.44 kg/m.              Outpatient management, obtain A1c lipid panel  Mild bradycardia hypotension             Monitor on telemetry  Hypothyroidism             Check TSH as an outpatient-clinically does not seem hyperthyroid  Procedures: Stress test  Echocardiogram as above  Consultations: Cardiologist  Discharge Exam: Vitals:   03/14/17 0943 03/14/17 1207  BP: 132/74 111/63  Pulse:  74  Resp:  18  Temp:  98.3 F (36.8 C)    General: Alert pleasant and oriented but still having some twinges of chest pain  Eating and drinking without event S1 and S2 no murmur rub or gallop Cardiovascular: s1 s2 no m/r/g Respiratory: Clinically clear no added sound Euthymic, pleasant    Discharge Instructions   Discharge Instructions    Diet - low sodium heart healthy    Complete by:  As directed    Discharge instructions    Complete by:  As directed    Please follow-up with cardiologist in the outpatient setting Continue aspirin 325 daily Follow-up with  a psychiatrist/psychologist in about 2 weeks You have been given Ativan twice a day as needed to use when you feel anxious and we have discussed breathing exercises that may help you not feel anxious Best of luck with work and have a good rest of the summer   Increase activity slowly    Complete by:  As directed      Current Discharge Medication List    START taking these medications   Details  LORazepam (ATIVAN) 0.5 MG tablet Take 1 tablet (0.5 mg total) by mouth 2 (two) times daily. Qty: 30 tablet, Refills: 0      CONTINUE these medications which have NOT CHANGED   Details  aspirin EC 325 MG tablet Take 325 mg by mouth once.    fexofenadine (ALLEGRA) 180 MG tablet Take 360 mg by mouth daily.    FLUoxetine (PROZAC) 40 MG capsule Take 40 mg by mouth daily.    Multiple Vitamins-Iron (CHLORELLA PO) Take 2 tablets by  mouth daily.    Omega-3 Fatty Acids (FISH OIL PO) Take 1 capsule by mouth daily.    thyroid (NATURE-THROID) 32.5 MG tablet Take 32.5 mg by mouth at bedtime. 8 tablets - 260 mg      STOP taking these medications     Aspirin-Acetaminophen-Caffeine (GOODY HEADACHE PO)      FLUoxetine (PROZAC) 20 MG tablet        Allergies  Allergen Reactions  . Corn-Containing Products Other (See Comments)    Cornstarch causes heartburn, swollen & weepy eyes, cystic acne, corn syrup causes diarrhea,   . Gluten Meal Rash and Other (See Comments)    Constipation, hot flashes, weight gain   Follow-up Information    Rinaldo Cloud, MD. Schedule an appointment as soon as possible for a visit in 2 day(s).   Specialty:  Cardiology Contact information: [address] [place] [address] [phone]            The results of significant diagnostics from this hospitalization (including imaging, microbiology, ancillary and laboratory) are listed below for reference.    Significant Diagnostic Studies: Dg Chest 2 View  Result Date: 03/11/2017 CLINICAL DATA:  Chest pain for 3 days. EXAM: CHEST  2 VIEW COMPARISON:  Radiograph 06/20/2008 FINDINGS: Normal mediastinum and cardiac silhouette. Normal pulmonary vasculature. No evidence of effusion, infiltrate, or pneumothorax. No acute bony abnormality. IMPRESSION: No acute cardiopulmonary process. Electronically Signed   By: Genevive Bi M.D.   On: 03/11/2017 10:40   Nm Myocar Multi W/spect W/wall Motion / Ef  Result Date: 03/14/2017 CLINICAL DATA:  Chest pain and shortness of breath. EXAM: MYOCARDIAL IMAGING WITH SPECT (REST AND EXERCISE) GATED LEFT VENTRICULAR WALL MOTION STUDY LEFT VENTRICULAR EJECTION FRACTION TECHNIQUE: Standard myocardial SPECT imaging was performed after resting intravenous injection of 10 mCi Tc-72m tetrofosmin. Subsequently, exercise tolerance test was performed by the patient under the supervision of the  Cardiology staff. At peak-stress, 30 mCi Tc-64m tetrofosmin was injected intravenously and standard myocardial SPECT imaging was performed. Quantitative gated imaging was also performed to evaluate left ventricular wall motion, and estimate left ventricular ejection fraction. COMPARISON:  Chest radiographs 03/11/2017. FINDINGS: Perfusion: Fixed perfusion defect at the apex without wall motion abnormality, likely apical thinning and/or breast attenuation. No reversal components to suggest ischemia. Wall Motion: Normal left ventricular wall motion. No left ventricular dilation. Left Ventricular Ejection Fraction: 63 % End diastolic volume 74 ml End systolic volume 28 ml IMPRESSION: 1. No reversible ischemia or infarction. Fixed perfusion defect  of the apex without wall motion abnormality, likely apical thinning and/or breast attenuation. 2. Normal left ventricular wall motion. 3. Left ventricular ejection fraction 63% 4. Non invasive risk stratification*: Low *2012 Appropriate Use Criteria for Coronary Revascularization Focused Update: J Am Coll Cardiol. 2012;59(9):857-881. http://content.dementiazones.comonlinejacc.org/article.aspx?articleid=1201161 Electronically Signed   By: Carey BullocksWilliam  Veazey M.D.   On: 03/14/2017 15:32    Microbiology: No results found for this or any previous visit (from the past 240 hour(s)).   Labs: Basic Metabolic Panel:  Recent Labs Lab 03/11/17 1007 03/12/17 1538  NA 141 137  K 3.8 4.4  CL 104 105  CO2  --  24  GLUCOSE 91 133*  BUN 13 14  CREATININE 0.50 0.69  CALCIUM  --  9.0   Liver Function Tests: No results for input(s): AST, ALT, ALKPHOS, BILITOT, PROT, ALBUMIN in the last 168 hours. No results for input(s): LIPASE, AMYLASE in the last 168 hours. No results for input(s): AMMONIA in the last 168 hours. CBC:  Recent Labs Lab 03/11/17 0944 03/11/17 1007 03/12/17 1538  WBC 5.1  --  7.2  NEUTROABS 2.1  --   --   HGB 13.1 12.2 13.8  HCT 37.5 36.0 39.2  MCV 80.3  --  81.7   PLT 245  --  273   Cardiac Enzymes:  Recent Labs Lab 03/11/17 0944 03/12/17 1538  TROPONINI <0.03 <0.03   BNP: BNP (last 3 results) No results for input(s): BNP in the last 8760 hours.  ProBNP (last 3 results) No results for input(s): PROBNP in the last 8760 hours.  CBG: No results for input(s): GLUCAP in the last 168 hours.     SignedRhetta Mura:  SAMTANI, JAI-GURMUKH MD   [facility] 03/14/2017, 4:36 PM

## 2017-03-14 NOTE — Progress Notes (Signed)
Subjective:  Continues to have a recurrent chest pain without associated symptoms. [facility]. Lexiscan Myoview results are pending  Objective:  Vital Signs in the last 24 hours: Temp:  [97.4 F (36.3 C)-98.2 F (36.8 C)] 98 F (36.7 C) (06/25 0419) Pulse Rate:  [55-75] 55 (06/25 0419) Resp:  [16-18] 16 (06/25 0419) BP: (95-183)/(59-89) 132/74 (06/25 0943) SpO2:  [98 %-100 %] 99 % (06/25 0419)  Intake/Output from previous day: 06/24 0701 - 06/25 0700 In: 480 [P.O.:480] Out: -  Intake/Output from this shift: No intake/output data recorded.  Physical Exam: Neck: no adenopathy, no carotid bruit, no JVD and supple, symmetrical, trachea midline Lungs: clear to auscultation bilaterally Heart: regular rate and rhythm, S1, S2 normal and Soft systolic murmur noted Abdomen: soft, non-tender; bowel sounds normal; no masses,  no organomegaly Extremities: extremities normal, atraumatic, no cyanosis or edema  Lab Results:  Recent Labs  03/12/17 1538  WBC 7.2  HGB 13.8  PLT 273    Recent Labs  03/12/17 1538  NA 137  K 4.4  CL 105  CO2 24  GLUCOSE 133*  BUN 14  CREATININE 0.69    Recent Labs  03/12/17 1538  TROPONINI <0.03   Hepatic Function Panel No results for input(s): PROT, ALBUMIN, AST, ALT, ALKPHOS, BILITOT, BILIDIR, IBILI in the last 72 hours.  Recent Labs  03/13/17 1110  CHOL 200   No results for input(s): PROTIME in the last 72 hours.  Imaging: Imaging results have been reviewed and No results found.  Cardiac Studies:  Assessment/Plan:  Recurrent Atypical chest pain with some features worrisome for angina rule out coronary insufficiency Elevated blood sugar Hyperlipidemia Hypothyroidism Anxiety disorder Depression Strong family history of coronary artery disease Plan Add atorvastatin 20 mg daily Check nuclear stress test results if negative for  ischemia okay to discharge from cardiac point of view Follow-up with me in 2 weeks  LOS: 0 days    Rinaldo CloudHarwani, Mohan 03/14/2017, 11:31 AM

## 2017-03-14 NOTE — Progress Notes (Signed)
All discharge instructions reviewed in detail with pt and husband with understanding verbalized.  No questions or concerns prior to discharge.  Home medications retrieved from pharmacy & reviewed in front of pt.  Pt stated all meds returned & paperwork signed/placed in chart.  Discharged to home in stable condition with husband.

## 2017-03-14 NOTE — Care Management Note (Signed)
Case Management Note  Patient Details  Name: Jenna Hayden MRN: [id number] Date of Birth: 08/12/1971  Subjective/Objective:     Pt in with chest pain. He is from home with family.                Action/Plan: Plan is for patient to return home when medically ready. CM following.  Expected Discharge Date:                  Expected Discharge Plan:  Home/Self Care  In-House Referral:     Discharge planning Services     Post Acute Care Choice:    Choice offered to:     DME Arranged:    DME Agency:     HH Arranged:    HH Agency:     Status of Service:  In process, will continue to follow  If discussed at Long Length of Stay Meetings, dates discussed:    Additional Comments:  Kermit BaloKelli F Willard, RN 03/14/2017, 1:24 PM

## 2017-10-11 DIAGNOSIS — R42 Dizziness and giddiness: Secondary | ICD-10-CM | POA: Diagnosis not present

## 2018-07-09 IMAGING — DX DG CHEST 2V
2 series · 2 of 2 positions shown · non-contrast
Comparison: Radiograph 06/20/2008

CLINICAL DATA: Chest pain for 3 days.

EXAM:
CHEST  2 VIEW

[chest pa]
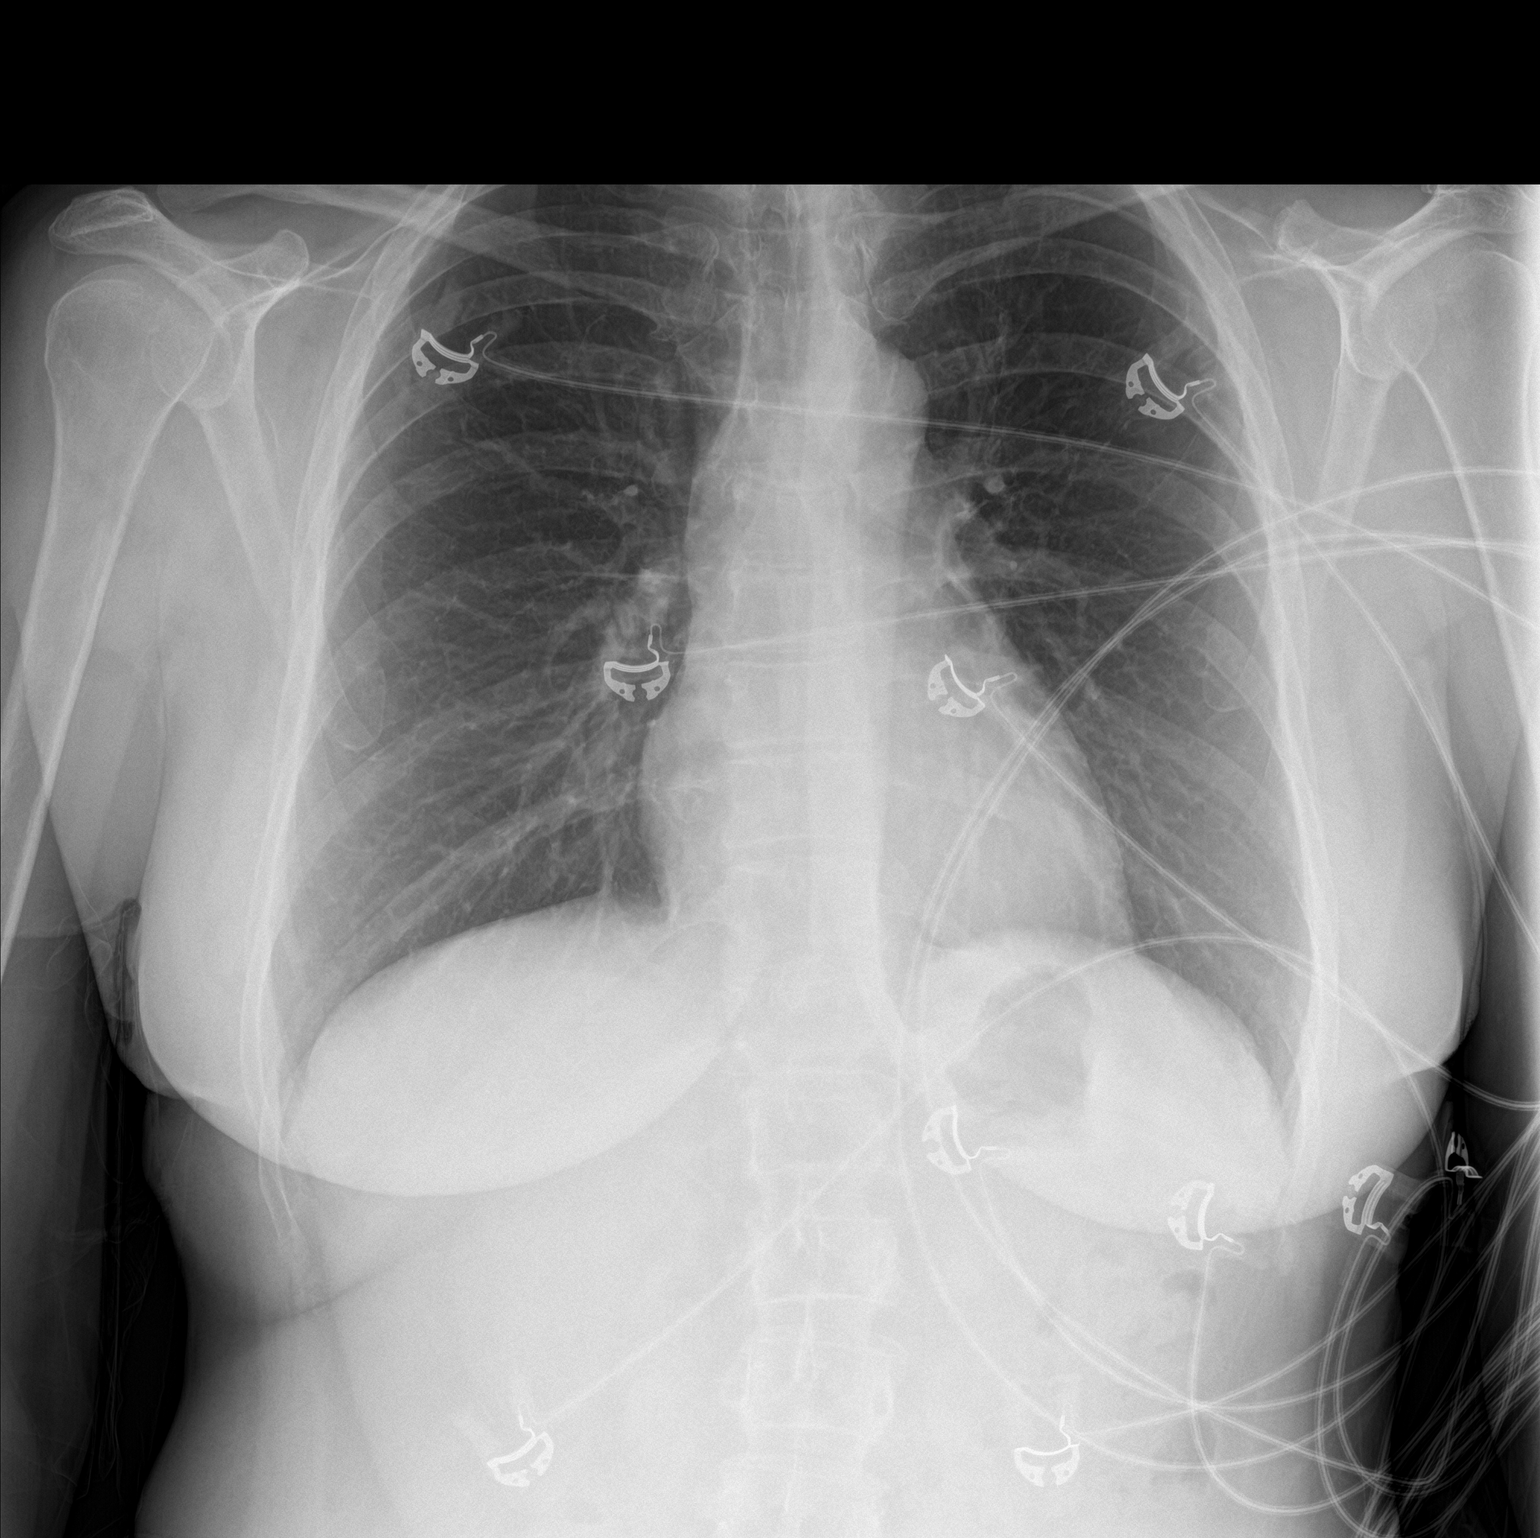

[chest lat]
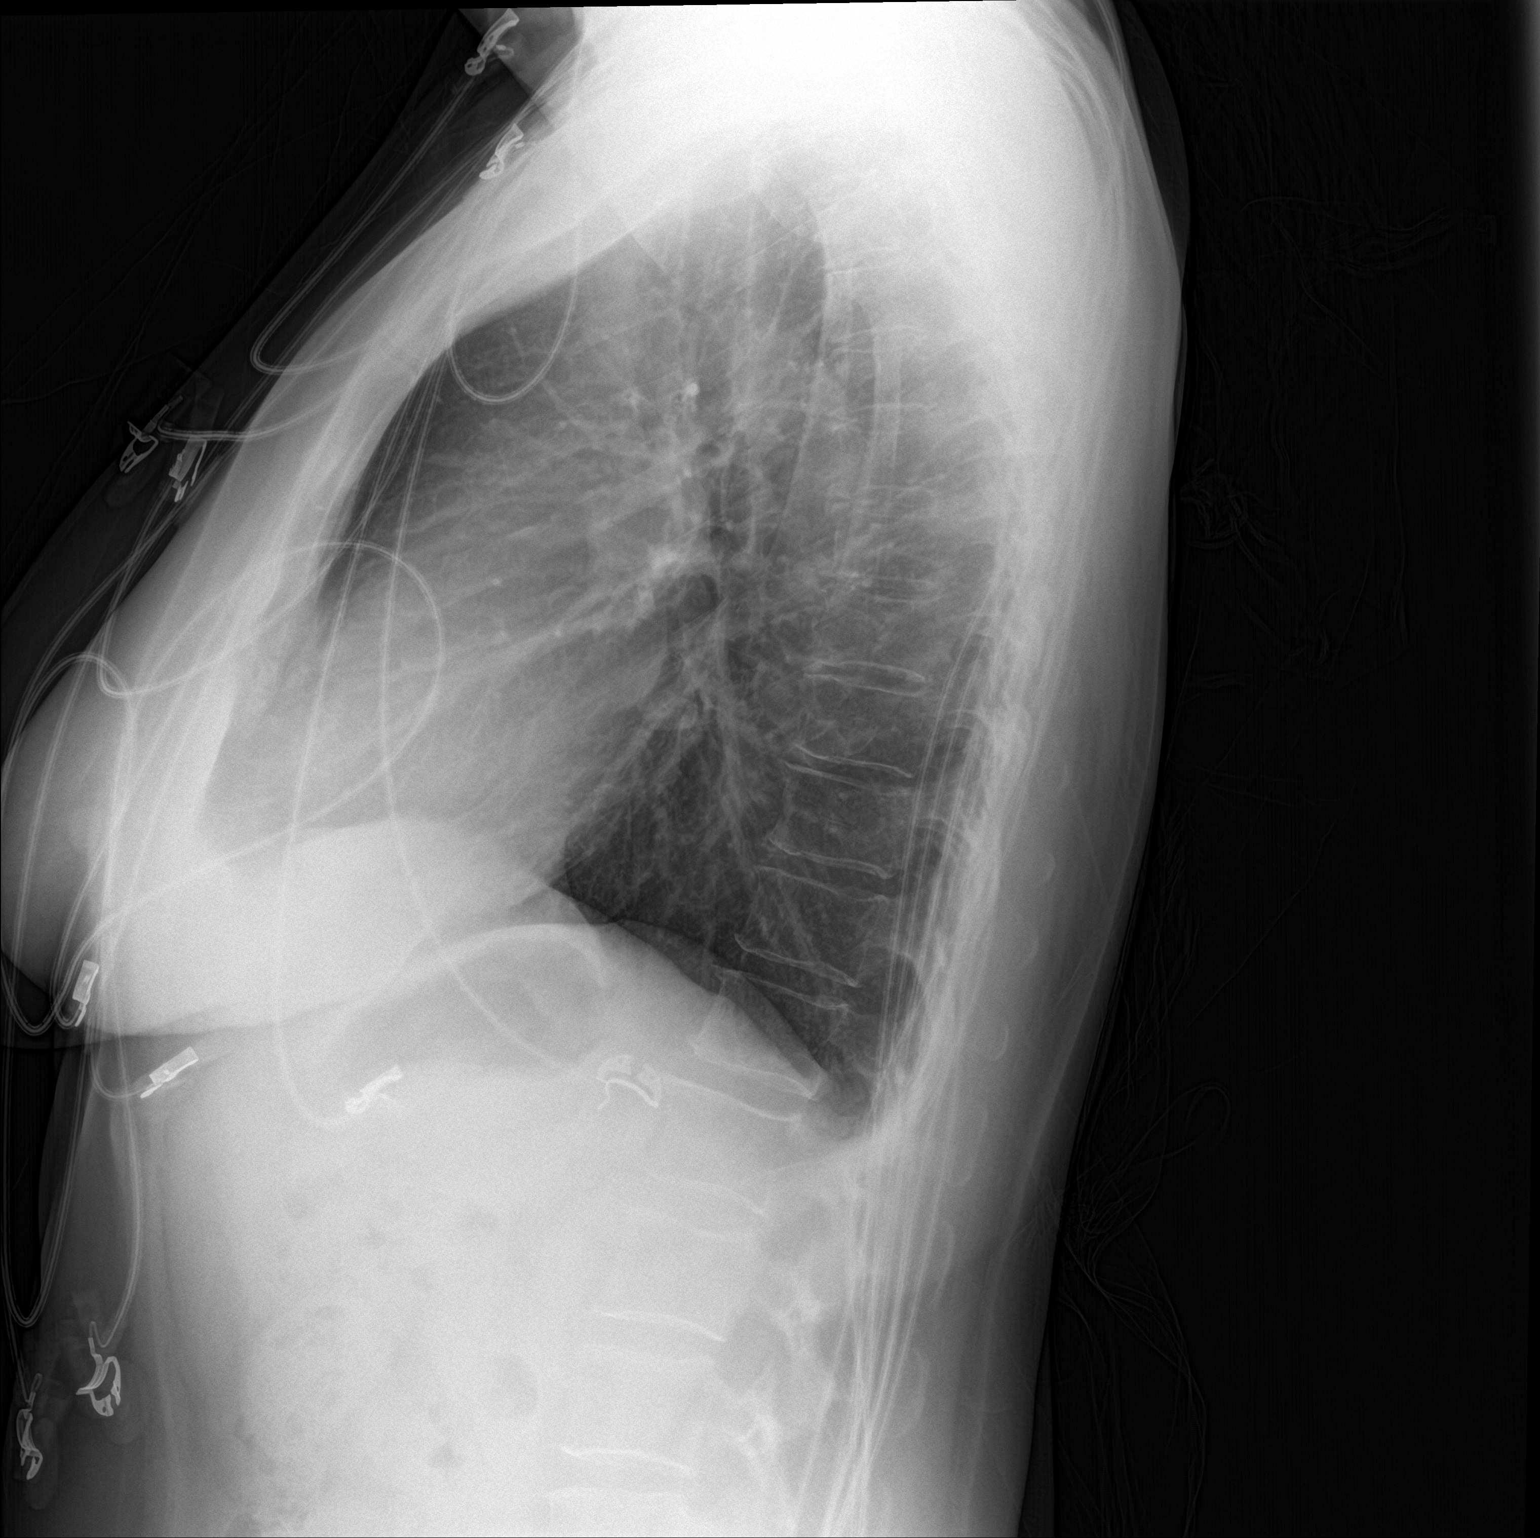

[2 of 2 positions shown; findings below may reference images not displayed]

FINDINGS: Normal mediastinum and cardiac silhouette. Normal pulmonary
vasculature. No evidence of effusion, infiltrate, or pneumothorax.
No acute bony abnormality.
IMPRESSION: No acute cardiopulmonary process.

## 2018-10-05 DIAGNOSIS — S82001D Unspecified fracture of right patella, subsequent encounter for closed fracture with routine healing: Secondary | ICD-10-CM | POA: Diagnosis not present

## 2018-10-06 DIAGNOSIS — Z1231 Encounter for screening mammogram for malignant neoplasm of breast: Secondary | ICD-10-CM | POA: Diagnosis not present

## 2018-10-17 DIAGNOSIS — L989 Disorder of the skin and subcutaneous tissue, unspecified: Secondary | ICD-10-CM | POA: Diagnosis not present

## 2018-10-17 DIAGNOSIS — Z23 Encounter for immunization: Secondary | ICD-10-CM | POA: Diagnosis not present

## 2018-10-17 DIAGNOSIS — F3341 Major depressive disorder, recurrent, in partial remission: Secondary | ICD-10-CM | POA: Diagnosis not present

## 2018-11-23 DIAGNOSIS — E039 Hypothyroidism, unspecified: Secondary | ICD-10-CM | POA: Diagnosis not present

## 2018-11-28 DIAGNOSIS — Z23 Encounter for immunization: Secondary | ICD-10-CM | POA: Diagnosis not present

## 2018-11-28 DIAGNOSIS — L821 Other seborrheic keratosis: Secondary | ICD-10-CM | POA: Diagnosis not present

## 2018-11-28 DIAGNOSIS — D2239 Melanocytic nevi of other parts of face: Secondary | ICD-10-CM | POA: Diagnosis not present

## 2018-11-28 DIAGNOSIS — L814 Other melanin hyperpigmentation: Secondary | ICD-10-CM | POA: Diagnosis not present

## 2018-11-29 DIAGNOSIS — F329 Major depressive disorder, single episode, unspecified: Secondary | ICD-10-CM | POA: Diagnosis not present

## 2018-12-08 DIAGNOSIS — M2241 Chondromalacia patellae, right knee: Secondary | ICD-10-CM | POA: Diagnosis not present

## 2018-12-08 DIAGNOSIS — M94261 Chondromalacia, right knee: Secondary | ICD-10-CM | POA: Diagnosis not present

## 2018-12-08 DIAGNOSIS — Y838 Other surgical procedures as the cause of abnormal reaction of the patient, or of later complication, without mention of misadventure at the time of the procedure: Secondary | ICD-10-CM | POA: Diagnosis not present

## 2018-12-08 DIAGNOSIS — Z472 Encounter for removal of internal fixation device: Secondary | ICD-10-CM | POA: Diagnosis not present

## 2018-12-08 DIAGNOSIS — X58XXXD Exposure to other specified factors, subsequent encounter: Secondary | ICD-10-CM | POA: Diagnosis not present

## 2018-12-08 DIAGNOSIS — S82001D Unspecified fracture of right patella, subsequent encounter for closed fracture with routine healing: Secondary | ICD-10-CM | POA: Diagnosis not present

## 2018-12-08 DIAGNOSIS — T8484XA Pain due to internal orthopedic prosthetic devices, implants and grafts, initial encounter: Secondary | ICD-10-CM | POA: Diagnosis not present

## 2018-12-08 DIAGNOSIS — M65861 Other synovitis and tenosynovitis, right lower leg: Secondary | ICD-10-CM | POA: Diagnosis not present

## 2019-02-14 DIAGNOSIS — E039 Hypothyroidism, unspecified: Secondary | ICD-10-CM | POA: Diagnosis not present

## 2019-02-14 DIAGNOSIS — F324 Major depressive disorder, single episode, in partial remission: Secondary | ICD-10-CM | POA: Diagnosis not present

## 2019-02-14 DIAGNOSIS — Z79890 Hormone replacement therapy (postmenopausal): Secondary | ICD-10-CM | POA: Diagnosis not present

## 2019-02-14 DIAGNOSIS — N952 Postmenopausal atrophic vaginitis: Secondary | ICD-10-CM | POA: Diagnosis not present

## 2019-06-01 DIAGNOSIS — E039 Hypothyroidism, unspecified: Secondary | ICD-10-CM | POA: Diagnosis not present

## 2019-06-01 DIAGNOSIS — F329 Major depressive disorder, single episode, unspecified: Secondary | ICD-10-CM | POA: Diagnosis not present

## 2019-09-18 DIAGNOSIS — Z01419 Encounter for gynecological examination (general) (routine) without abnormal findings: Secondary | ICD-10-CM | POA: Diagnosis not present

## 2019-09-18 DIAGNOSIS — N958 Other specified menopausal and perimenopausal disorders: Secondary | ICD-10-CM | POA: Diagnosis not present

## 2020-10-09 ENCOUNTER — Other Ambulatory Visit: Payer: Self-pay | Admitting: Family Medicine

## 2020-10-09 DIAGNOSIS — E041 Nontoxic single thyroid nodule: Secondary | ICD-10-CM

## 2020-10-22 ENCOUNTER — Ambulatory Visit
Admission: RE | Admit: 2020-10-22 | Discharge: 2020-10-22 | Disposition: A | Discharge status: Home / Self Care | Payer: BLUE CROSS/BLUE SHIELD | Source: Ambulatory Visit | Attending: Family Medicine | Admitting: Family Medicine

## 2020-10-22 DIAGNOSIS — E041 Nontoxic single thyroid nodule: Secondary | ICD-10-CM

## 2021-01-07 ENCOUNTER — Other Ambulatory Visit: Payer: Self-pay | Admitting: *Deleted

## 2021-01-07 ENCOUNTER — Telehealth: Payer: Self-pay | Admitting: Radiology

## 2021-01-07 ENCOUNTER — Ambulatory Visit (INDEPENDENT_AMBULATORY_CARE_PROVIDER_SITE_OTHER): Payer: 59

## 2021-01-07 DIAGNOSIS — R002 Palpitations: Secondary | ICD-10-CM

## 2021-01-07 NOTE — Telephone Encounter (Signed)
Call placed to pt to advise her that we will mail the monitor that was ordered by Baruch Goldmann, PA.  Advised pt that the minimum we do monitors is 3 days. Pt aware that instructions will be sent to her mychart.  Pt was assisted in changing her password.  Pt verbalized understanding.

## 2021-01-10 DIAGNOSIS — R002 Palpitations: Secondary | ICD-10-CM | POA: Diagnosis not present

## 2021-08-18 ENCOUNTER — Emergency Department (HOSPITAL_BASED_OUTPATIENT_CLINIC_OR_DEPARTMENT_OTHER): Payer: 59

## 2021-08-18 ENCOUNTER — Emergency Department (HOSPITAL_BASED_OUTPATIENT_CLINIC_OR_DEPARTMENT_OTHER)
Admission: EM | Admit: 2021-08-18 | Discharge: 2021-08-18 | Disposition: A | Discharge status: Home / Self Care | Payer: 59 | Attending: Emergency Medicine | Admitting: Emergency Medicine

## 2021-08-18 ENCOUNTER — Other Ambulatory Visit: Payer: Self-pay

## 2021-08-18 ENCOUNTER — Encounter (HOSPITAL_BASED_OUTPATIENT_CLINIC_OR_DEPARTMENT_OTHER): Payer: Self-pay | Admitting: Emergency Medicine

## 2021-08-18 DIAGNOSIS — Z79899 Other long term (current) drug therapy: Secondary | ICD-10-CM | POA: Insufficient documentation

## 2021-08-18 DIAGNOSIS — D72829 Elevated white blood cell count, unspecified: Secondary | ICD-10-CM | POA: Insufficient documentation

## 2021-08-18 DIAGNOSIS — E039 Hypothyroidism, unspecified: Secondary | ICD-10-CM | POA: Insufficient documentation

## 2021-08-18 DIAGNOSIS — K0889 Other specified disorders of teeth and supporting structures: Secondary | ICD-10-CM | POA: Diagnosis not present

## 2021-08-18 DIAGNOSIS — L0390 Cellulitis, unspecified: Secondary | ICD-10-CM

## 2021-08-18 DIAGNOSIS — K047 Periapical abscess without sinus: Secondary | ICD-10-CM

## 2021-08-18 DIAGNOSIS — R220 Localized swelling, mass and lump, head: Secondary | ICD-10-CM | POA: Diagnosis not present

## 2021-08-18 LAB — CBC WITH DIFFERENTIAL/PLATELET
Abs Immature Granulocytes: 0.03 10*3/uL (ref 0.00–0.07)
Basophils Absolute: 0 10*3/uL (ref 0.0–0.1)
Basophils Relative: 0 %
Eosinophils Absolute: 0.2 10*3/uL (ref 0.0–0.5)
Eosinophils Relative: 1 %
HCT: 40.1 % (ref 36.0–46.0)
Hemoglobin: 13.1 g/dL (ref 12.0–15.0)
Immature Granulocytes: 0 %
Lymphocytes Relative: 20 %
Lymphs Abs: 2.2 10*3/uL (ref 0.7–4.0)
MCH: 28.3 pg (ref 26.0–34.0)
MCHC: 32.7 g/dL (ref 30.0–36.0)
MCV: 86.6 fL (ref 80.0–100.0)
Monocytes Absolute: 1.2 10*3/uL — ABNORMAL HIGH (ref 0.1–1.0)
Monocytes Relative: 11 %
Neutro Abs: 7.6 10*3/uL (ref 1.7–7.7)
Neutrophils Relative %: 68 %
Platelets: 276 10*3/uL (ref 150–400)
RBC: 4.63 MIL/uL (ref 3.87–5.11)
RDW: 12.7 % (ref 11.5–15.5)
WBC: 11.2 10*3/uL — ABNORMAL HIGH (ref 4.0–10.5)
nRBC: 0 % (ref 0.0–0.2)

## 2021-08-18 LAB — BASIC METABOLIC PANEL
Anion gap: 8 (ref 5–15)
BUN: 16 mg/dL (ref 6–20)
CO2: 26 mmol/L (ref 22–32)
Calcium: 8.8 mg/dL — ABNORMAL LOW (ref 8.9–10.3)
Chloride: 99 mmol/L (ref 98–111)
Creatinine, Ser: 0.82 mg/dL (ref 0.44–1.00)
GFR, Estimated: >60 mL/min (ref >60)
Glucose, Bld: 113 mg/dL — ABNORMAL HIGH (ref 70–99)
Potassium: 4.1 mmol/L (ref 3.5–5.1)
Sodium: 133 mmol/L — ABNORMAL LOW (ref 135–145)

## 2021-08-18 MED ORDER — SODIUM CHLORIDE 0.9 % IV SOLN: INTRAVENOUS | Status: DC | PRN

## 2021-08-18 MED ORDER — SODIUM CHLORIDE 0.9 % IV SOLN: 1.5000 g | Freq: Once | INTRAVENOUS | Status: DC

## 2021-08-18 MED ORDER — KETOROLAC TROMETHAMINE 15 MG/ML IJ SOLN
15.0000 mg | Freq: Once | INTRAMUSCULAR | Status: AC
  Administered 2021-08-18: 15 mg via INTRAVENOUS

## 2021-08-18 MED ORDER — SODIUM CHLORIDE 0.9 % IV SOLN
3.0000 g | Freq: Once | INTRAVENOUS | Status: AC
  Administered 2021-08-18: 3 g via INTRAVENOUS

## 2021-08-18 MED ORDER — IOHEXOL 300 MG/ML  SOLN
75.0000 mL | Freq: Once | INTRAMUSCULAR | Status: AC | PRN
  Administered 2021-08-18: 75 mL via INTRAVENOUS

## 2021-08-18 NOTE — ED Provider Notes (Signed)
Patient here with ongoing facial swelling.  Has been on amoxicillin for 1 day.  Is supposed to follow-up with oral surgery.  She has overall findings of odontogenic cellulitis over the right mandible.  There appears to be a periapical lucency at the right anterior mandible molar.  Overall there is no major sign of abscess or deep infectious process.  She has follow-up with oral surgery in place.  She has been given a dose of IV antibiotics here.  She looks stable.  Discharged in good condition.   Virgina Norfolk, DO 08/18/21 [phone]

## 2021-08-18 NOTE — ED Triage Notes (Signed)
Pt was seen by dentist yesterday and told she had an infected tooth that needs to be excised. Pt was started on abx and pain medication. Pt states swelling is progressively worsening.

## 2021-08-18 NOTE — ED Provider Notes (Signed)
[facility] EMERGENCY DEPARTMENT Provider Note   CSN: [id number] Arrival date & time: 08/18/21  0400     History Chief Complaint  Patient presents with   Dental Pain    Jenna Hayden is a 50 y.o. female.  HPI     This a 50 year old female who presents with dental pain and swelling.  Patient was seen and evaluated by her endodontist yesterday for dental pain.  She was started on pain medication including ibuprofen and hydrocodone and amoxicillin.  She states that since that time she has had 3 doses of amoxicillin and has noted increasing swelling into the jaw and neck.  No difficulty swallowing.  No fevers.  Pain right now is fairly well controlled on hydrocodone at 3 out of 10.  She became concerned though with the extent of swelling.  She states that she did not have any dental procedures performed.  She does need to have a tooth extracted.  Past Medical History:  Diagnosis Date   Anxiety    Depression    Hypothyroid     Patient Active Problem List   Diagnosis Date Noted   Chest pain 03/12/2017   Anxiety    Depression     History reviewed. No pertinent surgical history.   OB History   No obstetric history on file.     History reviewed. No pertinent family history.  Social History   Tobacco Use   Smoking status: Never   Smokeless tobacco: Never  Vaping Use   Vaping Use: Never used  Substance Use Topics   Alcohol use: Yes    Comment: wine; sts not daily, but regularly   Drug use: No    Home Medications Prior to Admission medications   Medication Sig Start Date End Date Taking? Authorizing Provider  aspirin EC 325 MG tablet Take 325 mg by mouth once.    [provider]  atorvastatin (LIPITOR) 20 MG tablet Take 1 tablet (20 mg total) by mouth daily. 03/14/17   Rhetta Mura, MD  fexofenadine (ALLEGRA) 180 MG tablet Take 360 mg by mouth daily.    [provider]  FLUoxetine (PROZAC) 40 MG capsule Take 40 mg by mouth daily.  01/03/17   [provider]  LORazepam (ATIVAN) 0.5 MG tablet Take 1 tablet (0.5 mg total) by mouth 2 (two) times daily. 03/14/17   Rhetta Mura, MD  Multiple Vitamins-Iron (CHLORELLA PO) Take 2 tablets by mouth daily.    [provider]  Omega-3 Fatty Acids (FISH OIL PO) Take 1 capsule by mouth daily.    [provider]  thyroid (NATURE-THROID) 32.5 MG tablet Take 32.5 mg by mouth at bedtime. 8 tablets - 260 mg    [provider]    Allergies    Corn-containing products and Gluten meal  Review of Systems   Review of Systems  Constitutional:  Negative for fever.  HENT:  Positive for dental problem and facial swelling. Negative for trouble swallowing.   Respiratory:  Negative for shortness of breath.   Cardiovascular:  Negative for chest pain.  All other systems reviewed and are negative.  Physical Exam Updated Vital Signs BP (!) 145/87   Pulse 74   Temp 98.6 F (37 C) (Oral)   Resp 16   Ht 1.6 m (5\' 3" )   Wt 72.6 kg   SpO2 98%   BMI 28.34 kg/m   Physical Exam Vitals and nursing note reviewed.  Constitutional:      Appearance: She is well-developed.  HENT:     Head: Normocephalic.     Comments: Swelling to the right side of the face involving the right mandibular area and submental region of the right neck, positive lymphadenopathy    Mouth/Throat:      Comments: Tenderness to palpation with periapical swelling noted at tooth #30, no drainable abscess, no fullness noted under the tongue, no trismus Eyes:     Pupils: Pupils are equal, round, and reactive to light.  Cardiovascular:     Rate and Rhythm: Normal rate and regular rhythm.  Pulmonary:     Effort: Pulmonary effort is normal. No respiratory distress.  Abdominal:     Palpations: Abdomen is soft.  Musculoskeletal:     Cervical back: Neck supple.     Right lower leg: No edema.     Left lower leg: No edema.  Lymphadenopathy:     Cervical: Cervical adenopathy present.   Skin:    General: Skin is warm and dry.  Neurological:     Mental Status: She is alert and oriented to person, place, and time.  Psychiatric:        Mood and Affect: Mood normal.    ED Results / Procedures / Treatments   Labs (all labs ordered are listed, but only abnormal results are displayed) Labs Reviewed  CBC WITH DIFFERENTIAL/PLATELET  BASIC METABOLIC PANEL    EKG None  Radiology No results found.  Procedures Procedures   Medications Ordered in ED Medications  ketorolac (TORADOL) 15 MG/ML injection 15 mg (has no administration in time range)    ED Course  I have reviewed the triage vital signs and the nursing notes.  Pertinent labs & imaging results that were available during my care of the patient were reviewed by me and considered in my medical decision making (see chart for details).    MDM Rules/Calculators/A&P                           Patient presents with worsening facial swelling.  Was seen by her endodontist and started on antibiotics.  She has had 2 doses at this time.  She is nontoxic and ABCs are intact.  She is afebrile.  I do not see any drainable infectious process.  There are no overlying skin changes.  May all be reactive and cellulitis.  Labs obtained.  Slight leukocytosis to 11.2.  Otherwise no metabolic derangements.  CT scan pending.  Patient in the meantime was given IV Unasyn given progression of symptoms.  CT to rule out Ludwig's or deep space infection. Final Clinical Impression(s) / ED Diagnoses Final diagnoses:  None    Rx / DC Orders ED Discharge Orders     None        Donevan Biller, Mayer Masker, MD 08/19/21 [phone]

## 2021-08-18 NOTE — Discharge Instructions (Signed)
Follow-up with your oral surgeon.  Please return if symptoms worsen.  Continue your antibiotics.

## 2021-10-23 DIAGNOSIS — F411 Generalized anxiety disorder: Secondary | ICD-10-CM | POA: Diagnosis not present

## 2021-10-23 DIAGNOSIS — Z0000 Encounter for general adult medical examination without abnormal findings: Secondary | ICD-10-CM | POA: Diagnosis not present

## 2021-10-23 DIAGNOSIS — E039 Hypothyroidism, unspecified: Secondary | ICD-10-CM | POA: Diagnosis not present

## 2021-10-23 DIAGNOSIS — Z13220 Encounter for screening for lipoid disorders: Secondary | ICD-10-CM | POA: Diagnosis not present

## 2021-10-23 DIAGNOSIS — F3341 Major depressive disorder, recurrent, in partial remission: Secondary | ICD-10-CM | POA: Diagnosis not present

## 2022-01-07 DIAGNOSIS — Z01419 Encounter for gynecological examination (general) (routine) without abnormal findings: Secondary | ICD-10-CM | POA: Diagnosis not present

## 2022-01-07 DIAGNOSIS — Z1231 Encounter for screening mammogram for malignant neoplasm of breast: Secondary | ICD-10-CM | POA: Diagnosis not present

## 2022-01-07 DIAGNOSIS — E559 Vitamin D deficiency, unspecified: Secondary | ICD-10-CM | POA: Diagnosis not present

## 2022-01-07 DIAGNOSIS — N958 Other specified menopausal and perimenopausal disorders: Secondary | ICD-10-CM | POA: Diagnosis not present

## 2022-02-19 IMAGING — US US THYROID
1 series · 14 of 25 positions shown · non-contrast
Comparison: 12/09/2014

CLINICAL DATA: Thyroid nodule

EXAM:
THYROID ULTRASOUND
TECHNIQUE: Ultrasound examination of the thyroid gland and adjacent soft
tissues was performed.

[Series 1: us thyroid · 0.08mm/px · 14 of 44 slices shown]
[im 1/44]
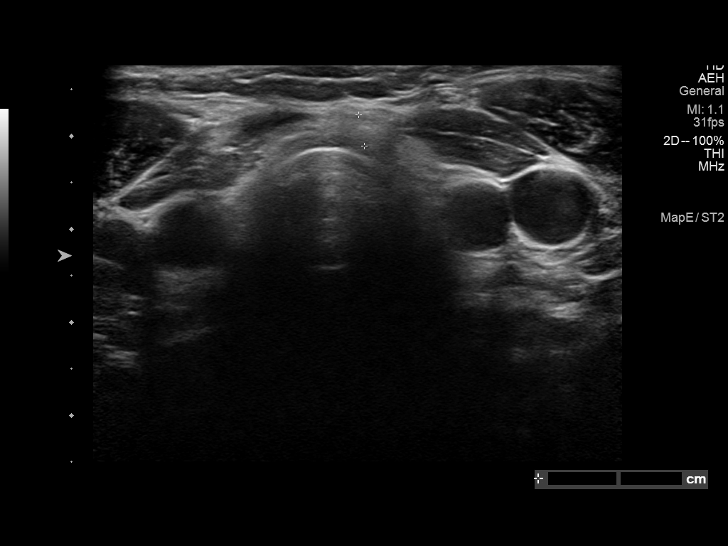
[im 4/44]
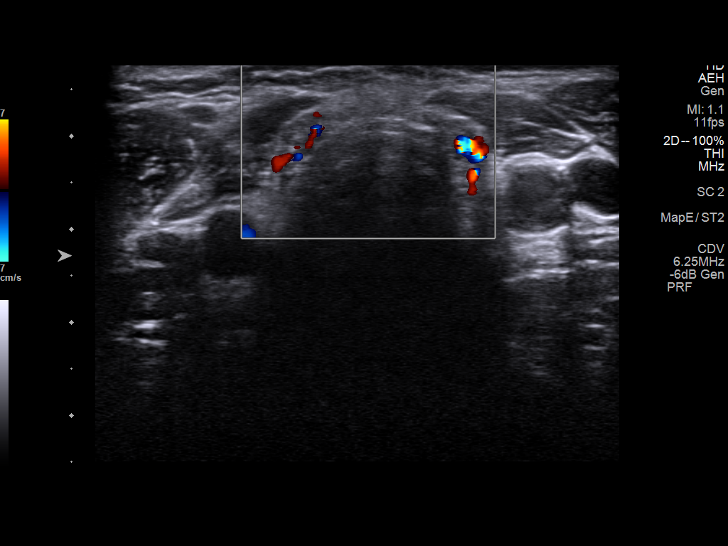
[im 8/44]
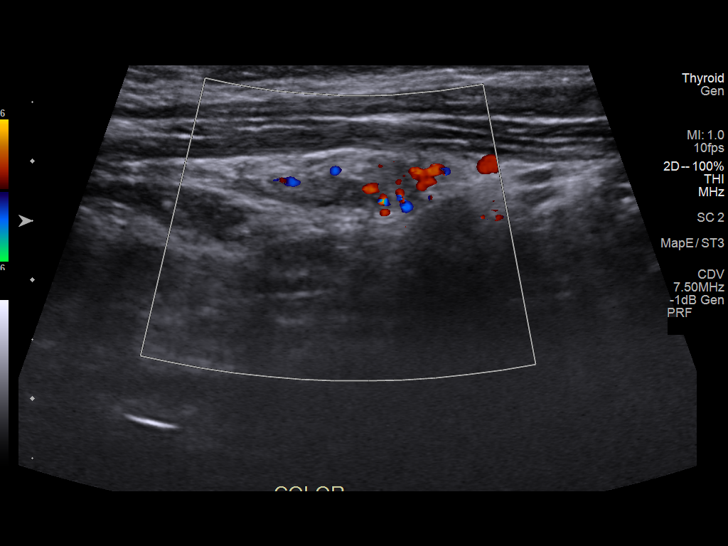
[im 11/44]
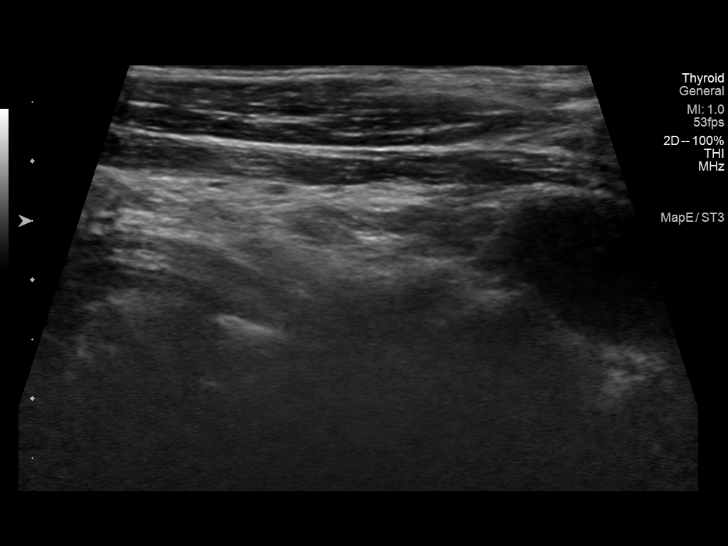
[im 15/44]
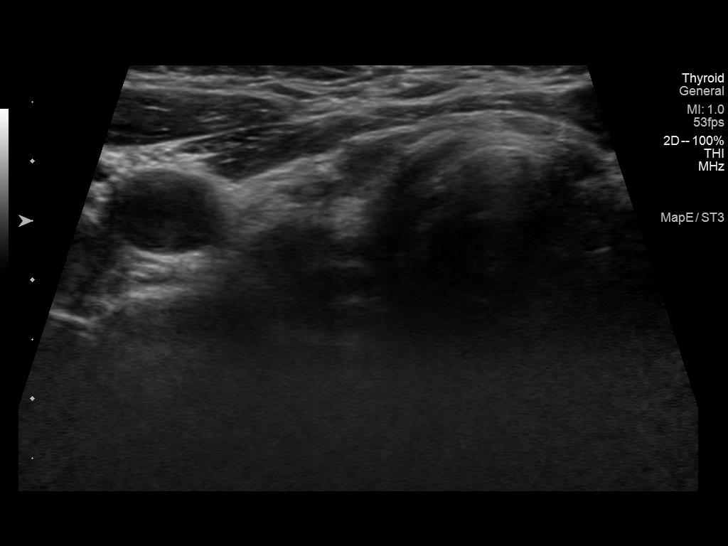
[im 17/44]
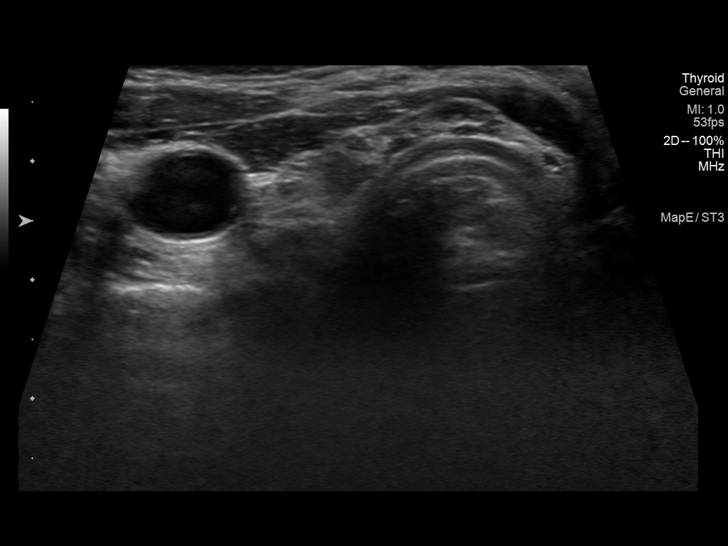
[im 20/44]
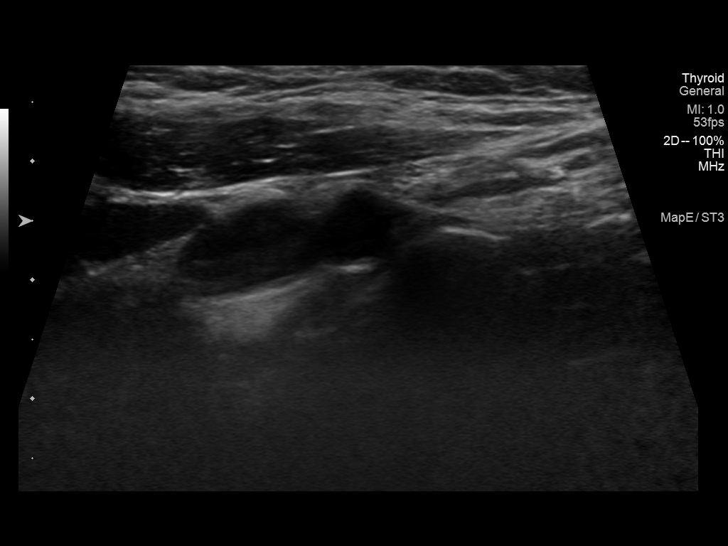
[im 24/44]
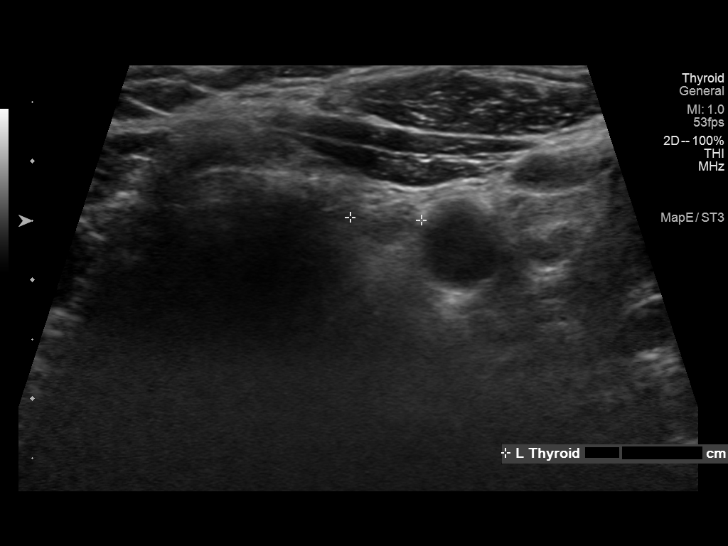
[im 27/44]
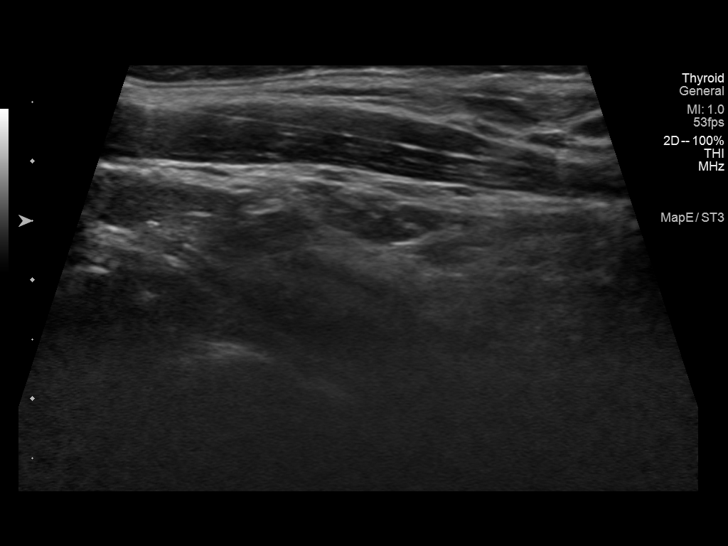
[im 29/44]
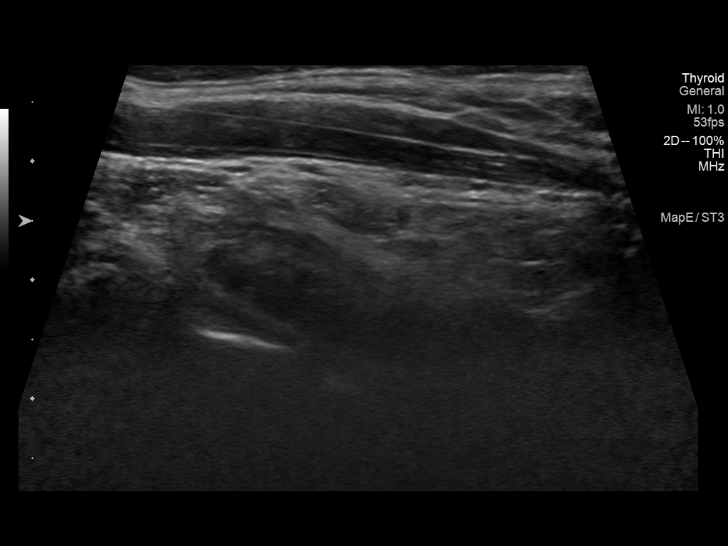
[im 33/44]
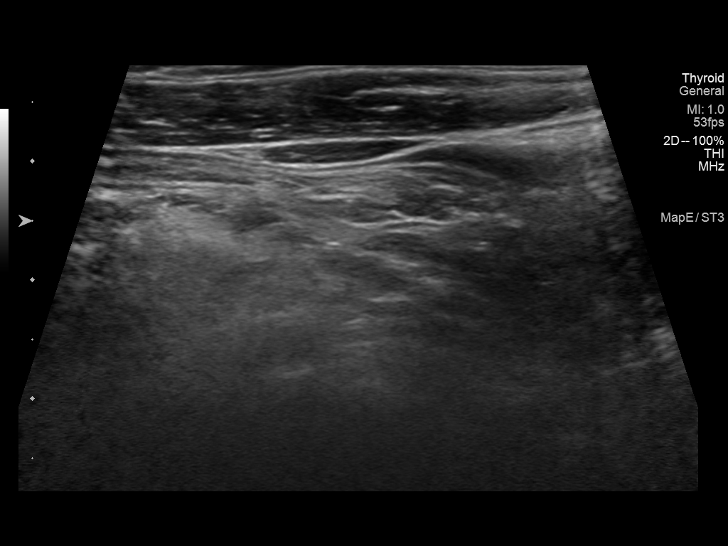
[im 36/44]
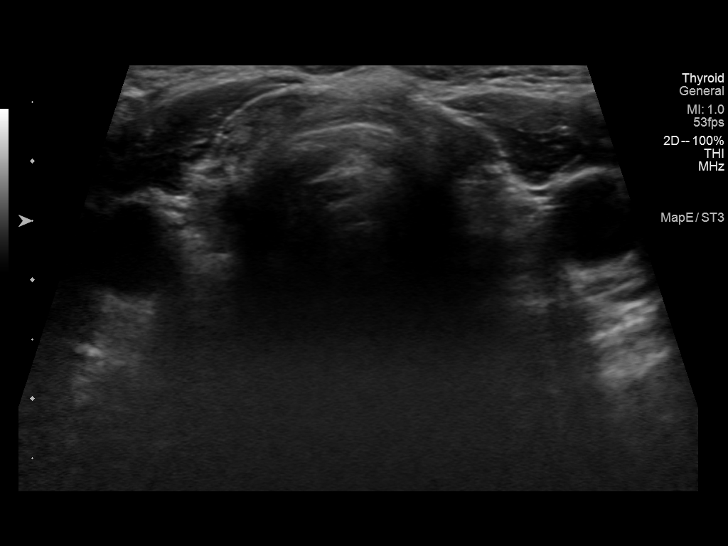
[im 40/44]
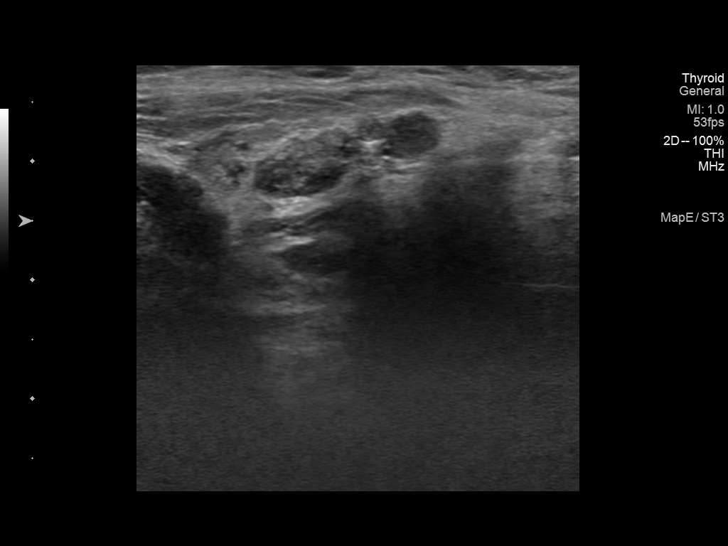
[im 44/44]
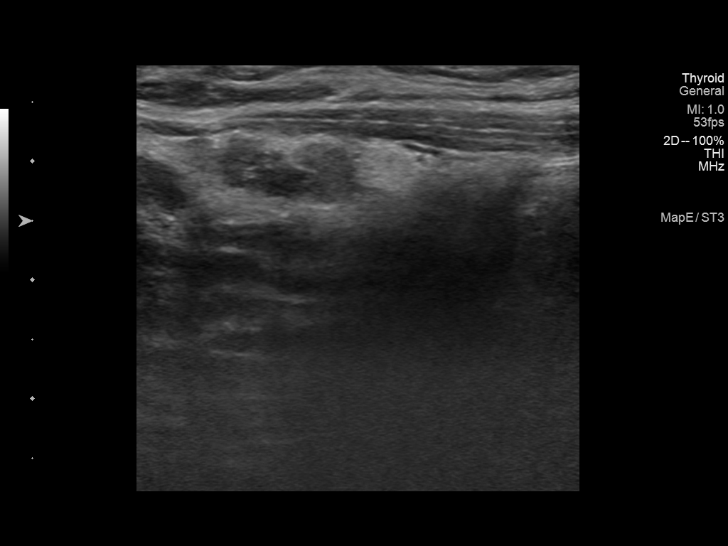

[14 of 25 positions shown; findings below may reference images not displayed]

FINDINGS: Parenchymal Echotexture: Markedly heterogenous

Isthmus: 3 mm

Right lobe: 1.7 x 0.5 x 0.7 cm

Left lobe: 1.8 x 0.4 x 0.6 cm

_________________________________________________________

Estimated total number of nodules >/= 1 cm: 0

Number of spongiform nodules >/=  2 cm not described below (TR1): 0

Number of mixed cystic and solid nodules >/= 1.5 cm not described
below (TR2): 0

_________________________________________________________

Chronic marked thyroid heterogeneity and atrophy. No
hypervascularity. No new or enlarging nodule. Negative for regional
adenopathy.
IMPRESSION: Chronic thyroid heterogeneity and atrophy compatible with sequelae
from medical thyroid disease.

No new or enlarging nodule.

The above is in keeping with the ACR TI-RADS recommendations - [HOSPITAL] 2919;[DATE].

## 2022-05-18 DIAGNOSIS — E039 Hypothyroidism, unspecified: Secondary | ICD-10-CM | POA: Diagnosis not present

## 2022-05-18 DIAGNOSIS — F324 Major depressive disorder, single episode, in partial remission: Secondary | ICD-10-CM | POA: Diagnosis not present

## 2022-05-18 DIAGNOSIS — F419 Anxiety disorder, unspecified: Secondary | ICD-10-CM | POA: Diagnosis not present

## 2022-06-03 DIAGNOSIS — D485 Neoplasm of uncertain behavior of skin: Secondary | ICD-10-CM | POA: Diagnosis not present

## 2022-06-03 DIAGNOSIS — L570 Actinic keratosis: Secondary | ICD-10-CM | POA: Diagnosis not present

## 2022-06-03 DIAGNOSIS — L578 Other skin changes due to chronic exposure to nonionizing radiation: Secondary | ICD-10-CM | POA: Diagnosis not present

## 2022-06-03 DIAGNOSIS — L814 Other melanin hyperpigmentation: Secondary | ICD-10-CM | POA: Diagnosis not present

## 2022-06-03 DIAGNOSIS — L821 Other seborrheic keratosis: Secondary | ICD-10-CM | POA: Diagnosis not present

## 2022-06-03 DIAGNOSIS — D225 Melanocytic nevi of trunk: Secondary | ICD-10-CM | POA: Diagnosis not present

## 2022-11-12 DIAGNOSIS — U071 COVID-19: Secondary | ICD-10-CM | POA: Diagnosis not present

## 2022-11-16 ENCOUNTER — Ambulatory Visit
Admission: EM | Admit: 2022-11-16 | Discharge: 2022-11-16 | Disposition: A | Discharge status: Home / Self Care | Payer: BC Managed Care – PPO | Attending: Family Medicine | Admitting: Family Medicine

## 2022-11-16 ENCOUNTER — Ambulatory Visit (INDEPENDENT_AMBULATORY_CARE_PROVIDER_SITE_OTHER): Payer: BC Managed Care – PPO

## 2022-11-16 DIAGNOSIS — R0602 Shortness of breath: Secondary | ICD-10-CM

## 2022-11-16 DIAGNOSIS — R911 Solitary pulmonary nodule: Secondary | ICD-10-CM

## 2022-11-16 DIAGNOSIS — U071 COVID-19: Secondary | ICD-10-CM

## 2022-11-16 MED ORDER — ALBUTEROL SULFATE HFA 108 (90 BASE) MCG/ACT IN AERS: 1.0000 | INHALATION_SPRAY | Freq: Four times a day (QID) | RESPIRATORY_TRACT | 0 refills | Status: AC | PRN

## 2022-11-16 MED ORDER — DEXAMETHASONE 6 MG PO TABS: 6.0000 mg | ORAL_TABLET | Freq: Two times a day (BID) | ORAL | 0 refills | Status: DC

## 2022-11-16 MED ORDER — DEXAMETHASONE 6 MG PO TABS: 6.0000 mg | ORAL_TABLET | Freq: Every day | ORAL | 0 refills | Status: AC

## 2022-11-16 MED ORDER — BENZONATATE 200 MG PO CAPS: 200.0000 mg | ORAL_CAPSULE | Freq: Three times a day (TID) | ORAL | 0 refills | Status: AC | PRN

## 2022-11-16 NOTE — Discharge Instructions (Addendum)
Make sure you are drinking lots of fluids Run a humidifier if you have 1 Take dexamethasone (steroid) once a day for 5 days Use albuterol inhaler as needed for shortness of breath Take Tessalon as needed for cough.  May take 2-3 times a day Call to make an appointment with your primary care doctor.  You need to follow-up on the chest x-ray finding

## 2022-11-16 NOTE — ED Notes (Signed)
O2 sats checked in waiting room - 98% w/ HR of 78

## 2022-11-16 NOTE — ED Triage Notes (Addendum)
Pt c/o shortness of breath due to COVID infection. Tested pos 2/22. Last fever was 2 days ago. Rx'd paxlovid, has two days left. Also taking mucinex prn. Hx of pneumonia in past.

## 2022-11-16 NOTE — ED Provider Notes (Signed)
[facility]    CSN: [id number] Arrival date & time: 11/16/22  1022      History   Chief Complaint Chief Complaint  Patient presents with   Shortness of Breath   Covid Positive    11/11/22    HPI Jenna Hayden is a 52 y.o. female.   HPI Patient started having symptoms on 11/11/2022.  She was found to be COVID-positive.  On Tuesday/23/24 she was started on Paxlovid.  She states that her fever and symptoms have improved slightly.  This morning, however, when she woke up she had increased mucus in her chest, increased coughing, and a spell of acute shortness of breath where she felt like she could not breathe.  This was frightening for her.  She is here for evaluation.  Should not be at high risk for COVID complications.  She has had 2 vaccines but no boosters.  She has no underlying lung disease.  She is 52 years of age.  Past Medical History:  Diagnosis Date   Anxiety    Depression    Hypothyroid     Patient Active Problem List   Diagnosis Date Noted   Chest pain 03/12/2017   Anxiety    Depression     History reviewed. No pertinent surgical history.  OB History   No obstetric history on file.      Home Medications    Prior to Admission medications   Medication Sig Start Date End Date Taking? Authorizing Provider  albuterol (VENTOLIN HFA) 108 (90 Base) MCG/ACT inhaler Inhale 1-2 puffs into the lungs every 6 (six) hours as needed for wheezing or shortness of breath. 11/16/22  Yes Raylene Everts, MD  benzonatate (TESSALON) 200 MG capsule Take 1 capsule (200 mg total) by mouth 3 (three) times daily as needed for cough. 11/16/22  Yes Raylene Everts, MD  buPROPion (WELLBUTRIN XL) 150 MG 24 hr tablet  11/16/21  Yes [provider]  aspirin EC 325 MG tablet Take 325 mg by mouth once.    [provider]  dexamethasone (DECADRON) 6 MG tablet Take 1 tablet (6 mg total) by mouth daily. 11/16/22   Raylene Everts, MD  fexofenadine (ALLEGRA)  180 MG tablet Take 360 mg by mouth daily.    [provider]  FLUoxetine (PROZAC) 40 MG capsule Take 40 mg by mouth daily. 01/03/17   [provider]  LORazepam (ATIVAN) 0.5 MG tablet Take 1 tablet (0.5 mg total) by mouth 2 (two) times daily. 03/14/17   Nita Sells, MD  Multiple Vitamins-Iron (CHLORELLA PO) Take 2 tablets by mouth daily.    [provider]  Omega-3 Fatty Acids (FISH OIL PO) Take 1 capsule by mouth daily.    [provider]    Family History History reviewed. No pertinent family history.  Social History Social History   Tobacco Use   Smoking status: Never   Smokeless tobacco: Never  Vaping Use   Vaping Use: Never used  Substance Use Topics   Alcohol use: Yes    Comment: wine; sts not daily, but regularly   Drug use: No     Allergies   Corn-containing products and Gluten meal   Review of Systems Review of Systems See HPI  Physical Exam Triage Vital Signs ED Triage Vitals  Enc Vitals Group     BP 11/16/22 1044 (!) 156/97     Pulse Rate 11/16/22 1044 95     Resp 11/16/22 1044 17  Temp 11/16/22 1044 98.7 F (37.1 C)     Temp Source 11/16/22 1044 Oral     SpO2 11/16/22 1044 96 %     Weight --      Height --      Head Circumference --      Peak Flow --      Pain Score 11/16/22 1046 0     Pain Loc --      Pain Edu? --      Excl. in [place]? --    No data found.  Updated Vital Signs BP (!) 156/97 (BP Location: Left Arm)   Pulse 95   Temp 98.7 F (37.1 C) (Oral)   Resp 17   SpO2 96%      Physical Exam Constitutional:      General: She is not in acute distress.    Appearance: She is well-developed. She is ill-appearing.  HENT:     Head: Normocephalic and atraumatic.     Right Ear: Tympanic membrane normal.     Left Ear: Tympanic membrane normal.     Nose: Congestion present. No rhinorrhea.     Mouth/Throat:     Mouth: Mucous membranes are moist.     Pharynx: No pharyngeal swelling or posterior  oropharyngeal erythema.  Eyes:     Conjunctiva/sclera: Conjunctivae normal.     Pupils: Pupils are equal, round, and reactive to light.  Cardiovascular:     Rate and Rhythm: Normal rate.     Heart sounds:     Friction rub present.  Pulmonary:     Effort: Pulmonary effort is normal. No respiratory distress.     Breath sounds: Wheezing and rhonchi present.  Abdominal:     General: There is no distension.     Palpations: Abdomen is soft.  Musculoskeletal:        General: Normal range of motion.     Cervical back: Normal range of motion.  Skin:    General: Skin is warm and dry.  Neurological:     Mental Status: She is alert.      UC Treatments / Results  Labs (all labs ordered are listed, but only abnormal results are displayed) Labs Reviewed - No data to display  EKG   Radiology DG Chest 2 View  Result Date: 11/16/2022 CLINICAL DATA:  Provided history: COVID positive, shortness of breath. EXAM: CHEST - 2 VIEW COMPARISON:  Prior chest radiographs 03/11/2017 and earlier. FINDINGS: Heart size within normal limits. A 5 mm nodular opacity projects in the region of the posterior mid lungs on the lateral view radiograph (see annotation on image). No evidence of airspace consolidation elsewhere. No evidence of pleural effusion or pneumothorax. No acute bony abnormality identified. IMPRESSION: 5 mm nodular opacity projecting in the region of the posterior mid lungs on the lateral view radiograph, concerning for a possible pulmonary nodule. A chest CT is recommended for further evaluation. Otherwise, no evidence of active cardiopulmonary disease. Electronically Signed   By: Kellie Simmering D.O.   On: 11/16/2022 11:57    Procedures Procedures (including critical care time)  Medications Ordered in UC Medications - No data to display  Initial Impression / Assessment and Plan / UC Course  I have reviewed the triage vital signs and the nursing notes.  Pertinent labs & imaging results that  were available during my care of the patient were reviewed by me and considered in my medical decision making (see chart for details).     I showed the  patient her x-ray.  Pointed out the nodule.  I gave her a copy of the result.  She will take this to her primary care doctor for follow-up. Final Clinical Impressions(s) / UC Diagnoses   Final diagnoses:  COVID-19  SOB (shortness of breath)  Solitary pulmonary nodule     Discharge Instructions      Make sure you are drinking lots of fluids Run a humidifier if you have 1 Take dexamethasone (steroid) once a day for 5 days Use albuterol inhaler as needed for shortness of breath Take Tessalon as needed for cough.  May take 2-3 times a day Call to make an appointment with your primary care doctor.  You need to follow-up on the chest x-ray finding     ED Prescriptions     Medication Sig Dispense Auth. Provider   albuterol (VENTOLIN HFA) 108 (90 Base) MCG/ACT inhaler Inhale 1-2 puffs into the lungs every 6 (six) hours as needed for wheezing or shortness of breath. 18 g Raylene Everts, MD   dexamethasone (DECADRON) 6 MG tablet Take 1 tablet (6 mg total) by mouth daily. 5 tablet Raylene Everts, MD   benzonatate (TESSALON) 200 MG capsule Take 1 capsule (200 mg total) by mouth 3 (three) times daily as needed for cough. 21 capsule Raylene Everts, MD      PDMP not reviewed this encounter.   Raylene Everts, MD 11/16/22 [phone]

## 2022-11-17 ENCOUNTER — Telehealth: Payer: Self-pay | Admitting: Emergency Medicine

## 2022-11-17 NOTE — Telephone Encounter (Signed)
Call to see how Jenna Hayden was today. No answer- message left for any questions or concerns w/ call back #

## 2022-11-23 DIAGNOSIS — Z8241 Family history of sudden cardiac death: Secondary | ICD-10-CM | POA: Diagnosis not present

## 2022-11-23 DIAGNOSIS — F324 Major depressive disorder, single episode, in partial remission: Secondary | ICD-10-CM | POA: Diagnosis not present

## 2022-11-23 DIAGNOSIS — R9389 Abnormal findings on diagnostic imaging of other specified body structures: Secondary | ICD-10-CM | POA: Diagnosis not present

## 2022-11-23 DIAGNOSIS — F419 Anxiety disorder, unspecified: Secondary | ICD-10-CM | POA: Diagnosis not present

## 2022-11-23 DIAGNOSIS — E039 Hypothyroidism, unspecified: Secondary | ICD-10-CM | POA: Diagnosis not present

## 2022-11-23 DIAGNOSIS — Z0000 Encounter for general adult medical examination without abnormal findings: Secondary | ICD-10-CM | POA: Diagnosis not present

## 2022-11-25 DIAGNOSIS — Z1211 Encounter for screening for malignant neoplasm of colon: Secondary | ICD-10-CM | POA: Diagnosis not present

## 2022-12-07 ENCOUNTER — Other Ambulatory Visit: Payer: Self-pay | Admitting: Family Medicine

## 2022-12-07 DIAGNOSIS — Z1231 Encounter for screening mammogram for malignant neoplasm of breast: Secondary | ICD-10-CM | POA: Diagnosis not present

## 2022-12-07 DIAGNOSIS — R9389 Abnormal findings on diagnostic imaging of other specified body structures: Secondary | ICD-10-CM

## 2022-12-10 ENCOUNTER — Ambulatory Visit (INDEPENDENT_AMBULATORY_CARE_PROVIDER_SITE_OTHER): Payer: BC Managed Care – PPO

## 2022-12-10 DIAGNOSIS — R911 Solitary pulmonary nodule: Secondary | ICD-10-CM | POA: Diagnosis not present

## 2022-12-10 DIAGNOSIS — R9389 Abnormal findings on diagnostic imaging of other specified body structures: Secondary | ICD-10-CM | POA: Diagnosis not present

## 2022-12-10 MED ORDER — IOHEXOL 300 MG/ML  SOLN
100.0000 mL | Freq: Once | INTRAMUSCULAR | Status: AC | PRN
  Administered 2022-12-10: 75 mL via INTRAVENOUS

## 2023-01-13 DIAGNOSIS — Z8739 Personal history of other diseases of the musculoskeletal system and connective tissue: Secondary | ICD-10-CM | POA: Diagnosis not present

## 2023-01-13 DIAGNOSIS — M79605 Pain in left leg: Secondary | ICD-10-CM | POA: Diagnosis not present

## 2023-01-14 ENCOUNTER — Ambulatory Visit
Admission: RE | Admit: 2023-01-14 | Discharge: 2023-01-14 | Disposition: A | Discharge status: Home / Self Care | Payer: BC Managed Care – PPO | Source: Ambulatory Visit | Attending: Physician Assistant | Admitting: Physician Assistant

## 2023-01-14 ENCOUNTER — Other Ambulatory Visit: Payer: Self-pay | Admitting: Physician Assistant

## 2023-01-14 DIAGNOSIS — M79605 Pain in left leg: Secondary | ICD-10-CM

## 2023-01-14 DIAGNOSIS — M79662 Pain in left lower leg: Secondary | ICD-10-CM | POA: Diagnosis not present

## 2023-02-10 DIAGNOSIS — H5203 Hypermetropia, bilateral: Secondary | ICD-10-CM | POA: Diagnosis not present

## 2023-02-10 DIAGNOSIS — H524 Presbyopia: Secondary | ICD-10-CM | POA: Diagnosis not present

## 2023-02-10 DIAGNOSIS — H43393 Other vitreous opacities, bilateral: Secondary | ICD-10-CM | POA: Diagnosis not present

## 2023-02-10 DIAGNOSIS — H52223 Regular astigmatism, bilateral: Secondary | ICD-10-CM | POA: Diagnosis not present

## 2023-04-28 DIAGNOSIS — Z124 Encounter for screening for malignant neoplasm of cervix: Secondary | ICD-10-CM | POA: Diagnosis not present

## 2023-04-28 DIAGNOSIS — R6882 Decreased libido: Secondary | ICD-10-CM | POA: Diagnosis not present

## 2023-04-28 DIAGNOSIS — Z01419 Encounter for gynecological examination (general) (routine) without abnormal findings: Secondary | ICD-10-CM | POA: Diagnosis not present

## 2023-04-28 DIAGNOSIS — N958 Other specified menopausal and perimenopausal disorders: Secondary | ICD-10-CM | POA: Diagnosis not present

## 2023-04-28 DIAGNOSIS — M810 Age-related osteoporosis without current pathological fracture: Secondary | ICD-10-CM | POA: Diagnosis not present

## 2023-05-26 DIAGNOSIS — I700 Atherosclerosis of aorta: Secondary | ICD-10-CM | POA: Diagnosis not present

## 2023-05-26 DIAGNOSIS — E7800 Pure hypercholesterolemia, unspecified: Secondary | ICD-10-CM | POA: Diagnosis not present

## 2023-05-26 DIAGNOSIS — E039 Hypothyroidism, unspecified: Secondary | ICD-10-CM | POA: Diagnosis not present

## 2023-05-26 DIAGNOSIS — F419 Anxiety disorder, unspecified: Secondary | ICD-10-CM | POA: Diagnosis not present

## 2023-05-26 DIAGNOSIS — F329 Major depressive disorder, single episode, unspecified: Secondary | ICD-10-CM | POA: Diagnosis not present

## 2023-08-12 DIAGNOSIS — J029 Acute pharyngitis, unspecified: Secondary | ICD-10-CM | POA: Diagnosis not present

## 2023-11-16 DIAGNOSIS — H6992 Unspecified Eustachian tube disorder, left ear: Secondary | ICD-10-CM | POA: Diagnosis not present

## 2023-12-20 DIAGNOSIS — I700 Atherosclerosis of aorta: Secondary | ICD-10-CM | POA: Diagnosis not present

## 2023-12-20 DIAGNOSIS — F411 Generalized anxiety disorder: Secondary | ICD-10-CM | POA: Diagnosis not present

## 2023-12-20 DIAGNOSIS — E7800 Pure hypercholesterolemia, unspecified: Secondary | ICD-10-CM | POA: Diagnosis not present

## 2023-12-20 DIAGNOSIS — E039 Hypothyroidism, unspecified: Secondary | ICD-10-CM | POA: Diagnosis not present

## 2023-12-20 DIAGNOSIS — Z0000 Encounter for general adult medical examination without abnormal findings: Secondary | ICD-10-CM | POA: Diagnosis not present

## 2023-12-20 DIAGNOSIS — F329 Major depressive disorder, single episode, unspecified: Secondary | ICD-10-CM | POA: Diagnosis not present

## 2023-12-23 DIAGNOSIS — Z79890 Hormone replacement therapy (postmenopausal): Secondary | ICD-10-CM | POA: Diagnosis not present

## 2023-12-23 DIAGNOSIS — N951 Menopausal and female climacteric states: Secondary | ICD-10-CM | POA: Diagnosis not present

## 2023-12-23 DIAGNOSIS — N952 Postmenopausal atrophic vaginitis: Secondary | ICD-10-CM | POA: Diagnosis not present

## 2024-01-26 DIAGNOSIS — K219 Gastro-esophageal reflux disease without esophagitis: Secondary | ICD-10-CM | POA: Diagnosis not present

## 2024-01-26 DIAGNOSIS — R1310 Dysphagia, unspecified: Secondary | ICD-10-CM | POA: Diagnosis not present

## 2024-02-01 DIAGNOSIS — K222 Esophageal obstruction: Secondary | ICD-10-CM | POA: Diagnosis not present

## 2024-02-01 DIAGNOSIS — K3189 Other diseases of stomach and duodenum: Secondary | ICD-10-CM | POA: Diagnosis not present

## 2024-02-01 DIAGNOSIS — K219 Gastro-esophageal reflux disease without esophagitis: Secondary | ICD-10-CM | POA: Diagnosis not present

## 2024-02-01 DIAGNOSIS — R1310 Dysphagia, unspecified: Secondary | ICD-10-CM | POA: Diagnosis not present

## 2024-02-01 DIAGNOSIS — K2930 Chronic superficial gastritis without bleeding: Secondary | ICD-10-CM | POA: Diagnosis not present

## 2024-02-01 DIAGNOSIS — K449 Diaphragmatic hernia without obstruction or gangrene: Secondary | ICD-10-CM | POA: Diagnosis not present

## 2024-02-01 DIAGNOSIS — K259 Gastric ulcer, unspecified as acute or chronic, without hemorrhage or perforation: Secondary | ICD-10-CM | POA: Diagnosis not present

## 2024-02-15 DIAGNOSIS — H43393 Other vitreous opacities, bilateral: Secondary | ICD-10-CM | POA: Diagnosis not present

## 2024-02-15 DIAGNOSIS — H5203 Hypermetropia, bilateral: Secondary | ICD-10-CM | POA: Diagnosis not present

## 2024-02-15 DIAGNOSIS — H524 Presbyopia: Secondary | ICD-10-CM | POA: Diagnosis not present

## 2024-02-15 DIAGNOSIS — H52223 Regular astigmatism, bilateral: Secondary | ICD-10-CM | POA: Diagnosis not present

## 2024-02-16 DIAGNOSIS — R6882 Decreased libido: Secondary | ICD-10-CM | POA: Diagnosis not present

## 2024-02-16 DIAGNOSIS — N951 Menopausal and female climacteric states: Secondary | ICD-10-CM | POA: Diagnosis not present

## 2024-02-16 DIAGNOSIS — N950 Postmenopausal bleeding: Secondary | ICD-10-CM | POA: Diagnosis not present

## 2024-02-20 DIAGNOSIS — N950 Postmenopausal bleeding: Secondary | ICD-10-CM | POA: Diagnosis not present

## 2024-03-07 DIAGNOSIS — N950 Postmenopausal bleeding: Secondary | ICD-10-CM | POA: Diagnosis not present

## 2024-04-02 DIAGNOSIS — Z1211 Encounter for screening for malignant neoplasm of colon: Secondary | ICD-10-CM | POA: Diagnosis not present

## 2024-04-12 DIAGNOSIS — N950 Postmenopausal bleeding: Secondary | ICD-10-CM | POA: Diagnosis not present

## 2024-04-12 DIAGNOSIS — N951 Menopausal and female climacteric states: Secondary | ICD-10-CM | POA: Diagnosis not present

## 2024-04-12 DIAGNOSIS — R9389 Abnormal findings on diagnostic imaging of other specified body structures: Secondary | ICD-10-CM | POA: Diagnosis not present

## 2024-04-12 DIAGNOSIS — Z79890 Hormone replacement therapy (postmenopausal): Secondary | ICD-10-CM | POA: Diagnosis not present

## 2024-05-01 DIAGNOSIS — N950 Postmenopausal bleeding: Secondary | ICD-10-CM | POA: Diagnosis not present

## 2024-05-01 DIAGNOSIS — R9389 Abnormal findings on diagnostic imaging of other specified body structures: Secondary | ICD-10-CM | POA: Diagnosis not present

## 2024-05-07 ENCOUNTER — Encounter (HOSPITAL_COMMUNITY): Payer: Self-pay | Admitting: Obstetrics and Gynecology

## 2024-05-07 NOTE — Progress Notes (Addendum)
 Spoke w/ via phone for pre-op interview--- [facility] needs dos----  none per anesthesia. Surgeon orders requested 05/07/24.       Lab results------ COVID test -----patient states asymptomatic no test needed Arrive at -------0900 NPO after MN NO Solid Food.  Clear liquids from MN until---0800 Pre-Surgery Ensure or G2:  Med rec completed Medications to take morning of surgery ----- Wellbutrin, Prozac , NP thyroid . Progesterone  and Ativan -PRN. Diabetic medication -----  GLP1 agonist last dose: GLP1 instructions:  Patient instructed no nail polish to be worn day of surgery Patient instructed to bring photo id and insurance card day of surgery Patient aware to have Driver (ride ) / caregiver    for 24 hours after surgery - Husband Alm Sprawls Patient Special Instructions ----- Pre-Op special Instructions -----  Patient verbalized understanding of instructions that were given at this phone interview. Patient denies chest pain, sob, fever, cough at the interview.   Patient stated she spoke with surgeon and it was okay to continue 81mg  ASA.

## 2024-05-08 ENCOUNTER — Other Ambulatory Visit: Payer: Self-pay | Admitting: Obstetrics and Gynecology

## 2024-05-08 DIAGNOSIS — N950 Postmenopausal bleeding: Secondary | ICD-10-CM

## 2024-05-08 NOTE — H&P (Deleted)
   The note originally documented on this encounter has been moved the the encounter in which it belongs.

## 2024-05-08 NOTE — H&P (Signed)
 Reason for Appointment   1.  Preop Visit History of Present Illness    General:  53 y/o presents for pre-op visit. Pt is schedule for a hysteroscopy Dilation and curettage possible polypectomy with myosure on 05/09/2024 for the management of PMB and thickening enodmetrium.   she reports daily bleeding. she has increased the prometrium  to 200 mg daily..   IN REVIEW: Previous gyn ultrasound findings on 02/20/24 Uterus 7.61x3.92x4.06cm/ uterine volume 63.53ml Endo 0.42cm Anteverted uterus. Uterine fibroids-1) post 1.9x1.9x1.3cm. 2) post 1.6x1.2x1.1cm. endometrium trilayered.Bilateral ovs wnl. No adnexal masses noted.  Current Medications Taking buPROPion HCl ER (XL) 150 MG Tablet Extended Release 24 Hour TAKE 1 TABLET BY MOUTH EVERY MORNING FLUoxetine  HCl 40 MG Capsule TAKE 2 CAPSULES BY MOUTH DAILY Prometrium (Progesterone ) 200 MG Capsule 1 capsule at bedtime Orally Once a day Estradiol  0.05 MG/24HR Patch Twice Weekly 1 patch to skin Transdermal Two times a Week , Notes to Pharmacist: DC other RX Estradiol  patch Estradiol  0.1 MG/GM Cream as directed; _insert 1 gm (pea size amount) to vagina Vaginal twice a week at bedtime NP Thyroid (Thyroid ) 60 MG Tablet TAKE THREE TABLETS BY MOUTH DAILY LORazepam  0.5 MG Tablet 1/2-1 tablet Orally twice a day as needed Allegra Allergy(Fexofenadine HCl) 180 MG Tablet 2 tablets Orally Once a day , Notes to Pharmacist: generic Not-Taking HYDROcodone-Acetaminophen  5-325 MG Tablet 1 tablet 1 hour prior to procedure then 1 tab every 6 hours prn post procedure.as needed Orally as directed; every 6 hours as needed for severe pain Aspirin  81 MG Capsule 1 each day-no corn products due to allergy Orally Once a day Vitamin D3 Medication List reviewed and reconciled with the patient Past Medical History Depression. Allergy. Orthostatic hypotension. Hypothyroidism. osteporosis, Reclast infusion done 01/2021, evaluated by Endocrine. Routinely seen by  dermatology. Allergies. Gynecology, Dr. Darlen. Mammograms at [facility]. Surgical History wisdom teeth extract Patella Wired-right 2019 EGD/Dilation 2025 Family History Father: deceased, heart attack (48, first one), HTN, bleeding problems, diagnosed with Hypertension Mother: alive 14 yrs, asthma, hay fever, depression, anxiety, hyperlipidemia:colon polyps, diagnosed with CVA, Breast cancer Paternal Grand Father: deceased Paternal Grand Mother: deceased Maternal Grand Father: deceased, diagnosed with Hypertension Maternal Grand Mother: deceased, Heart Attack Sister 1: alive 59 yrs, diagnosed with Hypertension 1 sister(s) - healthy. No FH of colon cancer or liver disease Denies other gyn cancers. Social History    General:  Tobacco use cigarettes: Never smoked, Tobacco history last updated 05/01/2024, Vaping No. EXPOSURE TO PASSIVE SMOKE: no. Alcohol: yes, weekly, wine. Caffeine: yes, 2 servings daily, tea, coffee. Recreational drug use: yes, THC edibles. DIET: good, gluten free, no corn. Exercise: bikes 30 mins 5x weekly, weights 30 mins 2x weekly. DENTAL CARE: every 6 months. Marital Status: married. Children: 2 , Karleen Jude. 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 Religion: Catholic. Seat belt use: yes. Gyn History Sexual activity currently sexually active.  Periods : postmenopausal.  LMP 12 years ago- PMB-almost every day for the last 2-3 weeks.  Denies H/O Birth control.  Last pap smear date 2024 unsure of month or date per pt..  Last mammogram date 2024.  Abnormal pap smear abnormal cells in her early 20's.  Denies H/O STD.  menopause 2015.  Miscellaneous Dr. Darlen [facility] in [facility].  OB History Number of pregnancies  4.  Pregnancy # 1  1987, abortion.  Pregnancy # 2  1993, live birth, vaginal delivery, boy.  Pregnancy # 3  1996, live birth, vaginal delivery, boy.  Pregnancy # 4:  2002 or 2003- miscarriage.   Allergies Armour Thyroid : eye crusting - Side Effects Corn: swollen, crusty eyes - Allergy Gluten: rash - Allergy Hospitalization/Major Diagnostic Procedure Not in the past year 01/2024 Review of Systems    CONSTITUTIONAL:  Chills No. Fatigue No. Fever No. Night sweats No. Recent travel outside [place]  No. Sweats No. Weight change No.     OPHTHALMOLOGY:  Blurring of vision no. Change in vision no. Double vision no.     ENT:  Dizziness no. Nose bleeds no. Sore throat no. Teeth pain no.     ALLERGY:  Hives no.     CARDIOLOGY:  Chest pain no. High blood pressure no. Irregular heart beat no. Leg edema no. Palpitations no.     RESPIRATORY:  Shortness of breath no. Cough no. Wheezing no.     UROLOGY:  Pain with urination no. Urinary urgency no. Urinary frequency no. Urinary incontinence no. Difficulty urinating No. Blood in urine No.     GASTROENTEROLOGY:  Abdominal pain no. Appetite change no. Bloating/belching no. Blood in stool or on toilet paper no. Change in bowel movements no. Constipation no. Diarrhea no. Difficulty swallowing no. Nausea no.     FEMALE REPRODUCTIVE:  Vulvar pain no. Vulvar rash no. Abnormal vaginal bleeding , yes. Breast pain no. Nipple discharge no. Pain with intercourse no. Pelvic pain no. Unusual vaginal discharge no. Vaginal itching no.     MUSCULOSKELETAL:  Muscle aches no.     NEUROLOGY:  Headache no. Tingling/numbness no. Weakness no.     PSYCHOLOGY:  Depression no. Anxiety no. Nervousness no. Sleep disturbances no. Suicidal ideation no .     ENDOCRINOLOGY:  Excessive thirst no. Excessive urination no. Hair loss no. Heat or cold intolerance no.     HEMATOLOGY/LYMPH:  Abnormal bleeding no. Easy bruising no. Swollen glands no.     DERMATOLOGY:  New/changing skin lesion no. Rash no. Sores no.  Vital Signs Wt: 162.0, Wt change: 0.4 lbs, Ht: 63, BMI: 28.69, Pulse sitting: 74, BP sitting: 137/84. Examination    General Examination: CONSTITUTIONAL: alert,  oriented, NAD.  SKIN:  moist, warm.  EYES:  Conjunctiva clear.  LUNGS: good I:E efffort noted, clear to auscultation bilaterally.  HEART: regular rate and rhythm.  ABDOMEN: soft, non-tender/non-distended, bowel sounds present.  FEMALE GENITOURINARY: normal external genitalia, labia - unremarkable, vagina - pink moist mucosa, no lesions or abnormal discharge, cervix - no discharge or lesions or CMT, adnexa - no masses or tenderness, uterus - nontender and normal size on palpation.  EXTREMITIES: no edema present.  PSYCH:  affect normal, good eye contact.  Assessments 1. Postmenopausal bleeding - N95.0 (Primary)    2. Endometrial thickening on ultrasound - R93.89    Treatment 1. Postmenopausal bleeding        Notes: Planning hysteroscopy D/C possible polypectomy with myosure. Pt is advised she will be able to return home the same day if she is doing well. Discussed risks of hysteroscopy including but not limited to infection, bleeding, possible perforation of the uterus, with the need for further surgery. Pt advised to avoid NSAIDs (Aspirin , Aleve, Advil , Ibuprofen , Motrin ) from now until surgery given risk of bleeding during surgery. She may take Tylenol  for pain management. She is advised to avoid eating or drinking starting midnight prior to surgery. Discussed post-surgery avoidance of driving for 24 hours or intercourse for 2 weeks after procedure.,   2. Endometrial thickening on ultrasound        Notes: Planning hysteroscopy  D/C possible polypectomy with myosure. Pt is advised she will be able to return home the same day if she is doing well. Discussed risks of hysteroscopy including but not limited to infection, bleeding, possible perforation of the uterus, with the need for further surgery. Pt advised to avoid NSAIDs (Aspirin , Aleve, Advil , Ibuprofen , Motrin ) from now until surgery given risk of bleeding during surgery. She may take Tylenol  for pain management. She is advised to avoid eating  or drinking starting midnight prior to surgery. Discussed post-surgery avoidance of driving for 24 hours or intercourse for 2 weeks after procedure.,

## 2024-05-09 ENCOUNTER — Encounter (HOSPITAL_COMMUNITY)
Admission: RE | Disposition: A | Discharge status: Home / Self Care | Payer: Self-pay | Source: Home / Self Care | Attending: Obstetrics and Gynecology

## 2024-05-09 ENCOUNTER — Encounter (HOSPITAL_COMMUNITY): Payer: Self-pay | Admitting: Obstetrics and Gynecology

## 2024-05-09 ENCOUNTER — Other Ambulatory Visit: Payer: Self-pay

## 2024-05-09 ENCOUNTER — Ambulatory Visit (HOSPITAL_COMMUNITY): Payer: Self-pay | Admitting: Certified Registered Nurse Anesthetist

## 2024-05-09 ENCOUNTER — Ambulatory Visit (HOSPITAL_COMMUNITY)
Admission: RE | Admit: 2024-05-09 | Discharge: 2024-05-09 | Disposition: A | Discharge status: Home / Self Care | Attending: Obstetrics and Gynecology | Admitting: Obstetrics and Gynecology

## 2024-05-09 DIAGNOSIS — Z79899 Other long term (current) drug therapy: Secondary | ICD-10-CM | POA: Diagnosis not present

## 2024-05-09 DIAGNOSIS — Z79890 Hormone replacement therapy (postmenopausal): Secondary | ICD-10-CM | POA: Insufficient documentation

## 2024-05-09 DIAGNOSIS — N858 Other specified noninflammatory disorders of uterus: Secondary | ICD-10-CM | POA: Diagnosis not present

## 2024-05-09 DIAGNOSIS — D259 Leiomyoma of uterus, unspecified: Secondary | ICD-10-CM | POA: Diagnosis not present

## 2024-05-09 DIAGNOSIS — R9389 Abnormal findings on diagnostic imaging of other specified body structures: Secondary | ICD-10-CM | POA: Diagnosis not present

## 2024-05-09 DIAGNOSIS — E039 Hypothyroidism, unspecified: Secondary | ICD-10-CM | POA: Insufficient documentation

## 2024-05-09 DIAGNOSIS — N950 Postmenopausal bleeding: Secondary | ICD-10-CM | POA: Insufficient documentation

## 2024-05-09 LAB — BASIC METABOLIC PANEL WITH GFR
Anion gap: 9 (ref 5–15)
BUN: 14 mg/dL (ref 6–20)
CO2: 21 mmol/L — ABNORMAL LOW (ref 22–32)
Calcium: 9.1 mg/dL (ref 8.9–10.3)
Chloride: 107 mmol/L (ref 98–111)
Creatinine, Ser: 0.83 mg/dL (ref 0.44–1.00)
GFR, Estimated: >60 mL/min (ref >60)
Glucose, Bld: 103 mg/dL — ABNORMAL HIGH (ref 70–99)
Potassium: 4 mmol/L (ref 3.5–5.1)
Sodium: 137 mmol/L (ref 135–145)

## 2024-05-09 LAB — CBC
HCT: 43 % (ref 36.0–46.0)
Hemoglobin: 14.4 g/dL (ref 12.0–15.0)
MCH: 28.3 pg (ref 26.0–34.0)
MCHC: 33.5 g/dL (ref 30.0–36.0)
MCV: 84.5 fL (ref 80.0–100.0)
Platelets: 298 K/uL (ref 150–400)
RBC: 5.09 MIL/uL (ref 3.87–5.11)
RDW: 12.5 % (ref 11.5–15.5)
WBC: 7 K/uL (ref 4.0–10.5)
nRBC: 0 % (ref 0.0–0.2)

## 2024-05-09 SURGERY — DILATATION AND CURETTAGE /HYSTEROSCOPY
Anesthesia: General | Site: Vagina

## 2024-05-09 MED ORDER — SODIUM CHLORIDE 0.9 % IR SOLN
Status: DC | PRN
  Administered 2024-05-09: 3000 mL

## 2024-05-09 MED ORDER — ACETAMINOPHEN 500 MG PO TABS: 1000.0000 mg | ORAL_TABLET | Freq: Once | ORAL | Status: DC

## 2024-05-09 MED ORDER — PROPOFOL 10 MG/ML IV BOLUS
INTRAVENOUS | Status: AC
  Filled 2024-05-09: qty 20

## 2024-05-09 MED ORDER — BUPIVACAINE HCL (PF) 0.25 % IJ SOLN
INTRAMUSCULAR | Status: DC | PRN
  Administered 2024-05-09: 20 mL

## 2024-05-09 MED ORDER — SILVER NITRATE-POT NITRATE 75-25 % EX MISC
CUTANEOUS | Status: DC | PRN
  Administered 2024-05-09: 2

## 2024-05-09 MED ORDER — KETOROLAC TROMETHAMINE 30 MG/ML IJ SOLN
INTRAMUSCULAR | Status: DC | PRN
  Administered 2024-05-09: 30 mg via INTRAVENOUS

## 2024-05-09 MED ORDER — FENTANYL CITRATE (PF) 250 MCG/5ML IJ SOLN
INTRAMUSCULAR | Status: DC | PRN
  Administered 2024-05-09: 25 ug via INTRAVENOUS
  Administered 2024-05-09: 50 ug via INTRAVENOUS
  Administered 2024-05-09: 25 ug via INTRAVENOUS

## 2024-05-09 MED ORDER — MIDAZOLAM HCL 2 MG/2ML IJ SOLN
INTRAMUSCULAR | Status: AC
  Filled 2024-05-09: qty 2

## 2024-05-09 MED ORDER — LACTATED RINGERS IV SOLN: INTRAVENOUS | Status: DC

## 2024-05-09 MED ORDER — ONDANSETRON HCL 4 MG/2ML IJ SOLN: 4.0000 mg | Freq: Once | INTRAMUSCULAR | Status: DC | PRN

## 2024-05-09 MED ORDER — MIDAZOLAM HCL 2 MG/2ML IJ SOLN
INTRAMUSCULAR | Status: DC | PRN
  Administered 2024-05-09: 1 mg via INTRAVENOUS

## 2024-05-09 MED ORDER — POVIDONE-IODINE 10 % EX SWAB: 2.0000 | Freq: Once | CUTANEOUS | Status: DC

## 2024-05-09 MED ORDER — ACETAMINOPHEN 10 MG/ML IV SOLN
INTRAVENOUS | Status: AC
  Filled 2024-05-09: qty 100

## 2024-05-09 MED ORDER — IBUPROFEN 800 MG PO TABS: 800.0000 mg | ORAL_TABLET | Freq: Three times a day (TID) | ORAL | 0 refills | Status: AC | PRN

## 2024-05-09 MED ORDER — KETOROLAC TROMETHAMINE 30 MG/ML IJ SOLN
INTRAMUSCULAR | Status: AC
  Filled 2024-05-09: qty 1

## 2024-05-09 MED ORDER — DEXAMETHASONE SODIUM PHOSPHATE 10 MG/ML IJ SOLN
INTRAMUSCULAR | Status: AC
  Filled 2024-05-09: qty 1

## 2024-05-09 MED ORDER — DEXAMETHASONE SODIUM PHOSPHATE 10 MG/ML IJ SOLN
INTRAMUSCULAR | Status: DC | PRN
  Administered 2024-05-09: 10 mg via INTRAVENOUS

## 2024-05-09 MED ORDER — ONDANSETRON HCL 4 MG/2ML IJ SOLN
INTRAMUSCULAR | Status: AC
  Filled 2024-05-09: qty 2

## 2024-05-09 MED ORDER — PROPOFOL 10 MG/ML IV BOLUS
INTRAVENOUS | Status: DC | PRN
  Administered 2024-05-09: 170 mg via INTRAVENOUS

## 2024-05-09 MED ORDER — LIDOCAINE 2% (20 MG/ML) 5 ML SYRINGE
INTRAMUSCULAR | Status: DC | PRN
  Administered 2024-05-09: 80 mg via INTRAVENOUS

## 2024-05-09 MED ORDER — FENTANYL CITRATE (PF) 250 MCG/5ML IJ SOLN
INTRAMUSCULAR | Status: AC
  Filled 2024-05-09: qty 5

## 2024-05-09 MED ORDER — CHLORHEXIDINE GLUCONATE 0.12 % MT SOLN
15.0000 mL | Freq: Once | OROMUCOSAL | Status: AC
  Administered 2024-05-09: 15 mL via OROMUCOSAL

## 2024-05-09 MED ORDER — FENTANYL CITRATE (PF) 100 MCG/2ML IJ SOLN: 25.0000 ug | INTRAMUSCULAR | Status: DC | PRN

## 2024-05-09 MED ORDER — ORAL CARE MOUTH RINSE: 15.0000 mL | Freq: Once | OROMUCOSAL | Status: AC

## 2024-05-09 MED ORDER — AMISULPRIDE (ANTIEMETIC) 5 MG/2ML IV SOLN: 10.0000 mg | Freq: Once | INTRAVENOUS | Status: DC | PRN

## 2024-05-09 MED ORDER — CHLORHEXIDINE GLUCONATE 0.12 % MT SOLN
OROMUCOSAL | Status: DC
Start: 2024-05-09 — End: 2024-05-09
  Filled 2024-05-09: qty 15

## 2024-05-09 MED ORDER — ACETAMINOPHEN 500 MG PO TABS
ORAL_TABLET | ORAL | Status: AC
  Filled 2024-05-09: qty 2

## 2024-05-09 MED ORDER — ACETAMINOPHEN 10 MG/ML IV SOLN
INTRAVENOUS | Status: DC | PRN
  Administered 2024-05-09: 1000 mg via INTRAVENOUS

## 2024-05-09 MED ORDER — LIDOCAINE 2% (20 MG/ML) 5 ML SYRINGE
INTRAMUSCULAR | Status: AC
  Filled 2024-05-09: qty 5

## 2024-05-09 MED ORDER — OXYCODONE HCL 5 MG PO TABS
5.0000 mg | ORAL_TABLET | Freq: Four times a day (QID) | ORAL | 0 refills | Status: AC | PRN
Start: 2024-05-09 — End: ?

## 2024-05-09 MED ORDER — ONDANSETRON HCL 4 MG/2ML IJ SOLN
INTRAMUSCULAR | Status: DC | PRN
  Administered 2024-05-09: 4 mg via INTRAVENOUS

## 2024-05-09 SURGICAL SUPPLY — 17 items
CANISTER SUCTION 3000ML PPV (SUCTIONS) ×1 IMPLANT
CATH ROBINSON RED A/P 16FR (CATHETERS) ×1 IMPLANT
COVER MAYO STAND STRL (DRAPES) ×1 IMPLANT
DEVICE MYOSURE LITE (MISCELLANEOUS) IMPLANT
DEVICE MYOSURE REACH (MISCELLANEOUS) IMPLANT
DILATOR CANAL MILEX (MISCELLANEOUS) ×1 IMPLANT
GLOVE BIOGEL PI IND STRL 6.5 (GLOVE) ×1 IMPLANT
GLOVE SURG ENC TEXT LTX SZ6.5 (GLOVE) ×1 IMPLANT
GOWN STRL REUS W/ TWL LRG LVL3 (GOWN DISPOSABLE) ×2 IMPLANT
KIT PROCEDURE FLUENT (KITS) ×1 IMPLANT
KIT TURNOVER KIT B (KITS) ×1 IMPLANT
PACK VAGINAL MINOR WOMEN LF (CUSTOM PROCEDURE TRAY) ×1 IMPLANT
PAD OB MATERNITY 11 LF (PERSONAL CARE ITEMS) ×1 IMPLANT
SEAL ROD LENS SCOPE MYOSURE (ABLATOR) ×1 IMPLANT
SOL .9 NS 3000ML IRR UROMATIC (IV SOLUTION) IMPLANT
TOWEL GREEN STERILE FF (TOWEL DISPOSABLE) ×1 IMPLANT
UNDERPAD 30X36 HEAVY ABSORB (UNDERPADS AND DIAPERS) ×1 IMPLANT

## 2024-05-09 NOTE — Op Note (Signed)
 05/09/2024  11:33 AM  PATIENT:  Jenna Hayden  53 y.o. female  PRE-OPERATIVE DIAGNOSIS:  Endometrial Thickening on Ultrasound Postmenopausal Bleeding  POST-OPERATIVE DIAGNOSIS:  Endometrial Thickening on UltrasoundPostmenopausal Bleeding  PROCEDURE:  Procedure(s): DILATATION AND CURETTAGE /HYSTEROSCOPY (N/A)  SURGEON:  Surgeons and Role:    DEWAINE Rosalva Sawyer, MD - Primary  PHYSICIAN ASSISTANT:   ASSISTANTS: none   ANESTHESIA:   general  EBL:  5 cc    BLOOD ADMINISTERED:none  DRAINS: none   LOCAL MEDICATIONS USED:  MARCAINE      SPECIMEN:  Source of Specimen:  Endometrial curettings  DISPOSITION OF SPECIMEN:  PATHOLOGY  COUNTS:  YES  TOURNIQUET:  * No tourniquets in log *  DICTATION: .Note written in EPIC  PLAN OF CARE: Discharge to home after PACU  PATIENT DISPOSITION:  PACU - hemodynamically stable.   Delay start of Pharmacological VTE agent (>24hrs) due to surgical blood loss or risk of bleeding: not applicable  Findings: normal appearing external genitalia, vaginal mucosa and cervix. Atrophic appearing endometrium No endometrial masses were present.   Procedure: Patient was taken to the operating room #7 at [facility]; where she was placed under general anesthesia. She was placed in the dorsal lithotomy position. She was prepped and draped in the usual sterile fashion. A speculum was placed into the vaginal vault. The anterior lip of the cervix was grasped with a single-tooth tenaculum. Quarter percent Marcaine  was injected at the 4 and 8:00 positions of the cervix. The cervix was then sounded to 6 cm. The cervix was dilated to approximately 6 mm. Diagnostic hysteroscope was inserted. The findings noted above. The hysteroscope was removed. Sharp curet was introduced and endometrial corrected curettings were obtained. The hysteroscope was then reinserted.  There was no evidence of perforation. Hysteroscope was then removed. The single-tooth tenaculum was removed  from the anterior lip of the cervix. Patient was noted to have bleeding from the tenaculum site. Silver  nitrate was applied and excellent hemostasis was noted. The speculum was removed from the patient's vagina. She was awakened from anesthesia taken care to the recovery  room awake and in stable condition. Sponge lap and needle counts were correct x 2.  Saline deficit was 30 cc.

## 2024-05-09 NOTE — H&P (Signed)
 Date of Initial H&P: 05/08/2024  History reviewed, patient examined, no change in status, stable for surgery.

## 2024-05-09 NOTE — Anesthesia Postprocedure Evaluation (Signed)
 Anesthesia Post Note  Patient: Arts administrator  Procedure(s) Performed: DILATATION AND CURETTAGE /HYSTEROSCOPY (Vagina )     Patient location during evaluation: PACU Anesthesia Type: General Level of consciousness: awake and alert Pain management: pain level controlled Vital Signs Assessment: post-procedure vital signs reviewed and stable Respiratory status: spontaneous breathing, nonlabored ventilation and respiratory function stable Cardiovascular status: blood pressure returned to baseline and stable Postop Assessment: no apparent nausea or vomiting Anesthetic complications: no   No notable events documented.  Last Vitals:  Vitals:   05/09/24 1215 05/09/24 1230  BP: 125/65 122/65  Pulse: 72 68  Resp: 13 13  Temp:    SpO2: 98% 98%    Last Pain:  Vitals:   05/09/24 1215  TempSrc:   PainSc: 0-No pain                 Garnette FORBES Skillern

## 2024-05-09 NOTE — Anesthesia Preprocedure Evaluation (Signed)
 Anesthesia Evaluation  Patient identified by MRN, date of birth, ID band Patient awake    Reviewed: Allergy & Precautions, NPO status , Patient's Chart, lab work & pertinent test results  Airway Mallampati: II  TM Distance: >3 FB Neck ROM: Full    Dental  (+) Dental Advisory Given, Missing   Pulmonary neg pulmonary ROS   Pulmonary exam normal breath sounds clear to auscultation       Cardiovascular negative cardio ROS Normal cardiovascular exam Rhythm:Regular Rate:Normal     Neuro/Psych  PSYCHIATRIC DISORDERS Anxiety Depression    negative neurological ROS     GI/Hepatic negative GI ROS, Neg liver ROS,,,  Endo/Other  Hypothyroidism    Renal/GU negative Renal ROS     Musculoskeletal negative musculoskeletal ROS (+)    Abdominal   Peds  Hematology negative hematology ROS (+)   Anesthesia Other Findings Day of surgery medications reviewed with the patient.  Reproductive/Obstetrics Endometrial Thickening on Ultrasound      Postmenopausal Bleeding                                Anesthesia Physical Anesthesia Plan  ASA: 2  Anesthesia Plan: General   Post-op Pain Management: Tylenol  PO (pre-op)* and Toradol  IV (intra-op)*   Induction: Intravenous  PONV Risk Score and Plan: 4 or greater and Midazolam , Ondansetron  and Dexamethasone   Airway Management Planned: LMA  Additional Equipment:   Intra-op Plan:   Post-operative Plan: Extubation in OR  Informed Consent: I have reviewed the patients History and Physical, chart, labs and discussed the procedure including the risks, benefits and alternatives for the proposed anesthesia with the patient or authorized representative who has indicated his/her understanding and acceptance.     Dental advisory given  Plan Discussed with: CRNA  Anesthesia Plan Comments:         Anesthesia Quick Evaluation

## 2024-05-09 NOTE — Transfer of Care (Signed)
 Immediate Anesthesia Transfer of Care Note  Patient: Jenna Hayden  Procedure(s) Performed: DILATATION AND CURETTAGE /HYSTEROSCOPY (Vagina )  Patient Location: PACU  Anesthesia Type:General  Level of Consciousness: drowsy  Airway & Oxygen Therapy: Patient Spontanous Breathing and Patient connected to nasal cannula oxygen  Post-op Assessment: Report given to RN and Post -op Vital signs reviewed and stable  Post vital signs: Reviewed and stable  Last Vitals:  Vitals Value Taken Time  BP 134/77   Temp    Pulse 77 05/09/24 11:48  Resp 15 05/09/24 11:48  SpO2 96 % 05/09/24 11:48  Vitals shown include unfiled device data.  Last Pain:  Vitals:   05/09/24 0920  TempSrc: Oral  PainSc: 0-No pain      Patients Stated Pain Goal: 3 (05/09/24 0920)  Complications: No notable events documented.

## 2024-05-09 NOTE — Discharge Instructions (Signed)
  Post Anesthesia Home Care Instructions  Activity: Get plenty of rest for the remainder of the day. A responsible individual must stay with you for 24 hours following the procedure.  For the next 24 hours, DO NOT: -Drive a car -Advertising copywriter -Drink alcoholic beverages -Take any medication unless instructed by your physician -Make any legal decisions or sign important papers.  Meals: Start with liquid foods such as gelatin or soup. Progress to regular foods as tolerated. Avoid greasy, spicy, heavy foods. If nausea and/or vomiting occur, drink only clear liquids until the nausea and/or vomiting subsides. Call your physician if vomiting continues.  Special Instructions/Symptoms: Your throat may feel dry or sore from the anesthesia or the breathing tube placed in your throat during surgery. If this causes discomfort, gargle with warm salt water. The discomfort should disappear within 24 hours.  Begin next dose of Tylenol  at 5 PM.

## 2024-05-09 NOTE — Anesthesia Procedure Notes (Signed)
 Procedure Name: LMA Insertion Date/Time: 05/09/2024 11:07 AM  Performed by: Harrold Macintosh, CRNAPre-anesthesia Checklist: Patient identified, Emergency Drugs available, Suction available, Patient being monitored and Timeout performed Patient Re-evaluated:Patient Re-evaluated prior to induction Oxygen Delivery Method: Circle system utilized Preoxygenation: Pre-oxygenation with 100% oxygen Induction Type: IV induction LMA: LMA inserted LMA Size: 4.0 Number of attempts: 1 Placement Confirmation: positive ETCO2 and breath sounds checked- equal and bilateral Tube secured with: Tape Dental Injury: Teeth and Oropharynx as per pre-operative assessment

## 2024-05-10 ENCOUNTER — Encounter (HOSPITAL_COMMUNITY): Payer: Self-pay | Admitting: Obstetrics and Gynecology

## 2024-05-11 LAB — SURGICAL PATHOLOGY

## 2024-05-22 DIAGNOSIS — N950 Postmenopausal bleeding: Secondary | ICD-10-CM | POA: Diagnosis not present

## 2024-05-22 DIAGNOSIS — Z79890 Hormone replacement therapy (postmenopausal): Secondary | ICD-10-CM | POA: Diagnosis not present

## 2024-05-22 DIAGNOSIS — N951 Menopausal and female climacteric states: Secondary | ICD-10-CM | POA: Diagnosis not present

## 2024-08-21 DIAGNOSIS — Z79890 Hormone replacement therapy (postmenopausal): Secondary | ICD-10-CM | POA: Diagnosis not present

## 2024-08-21 DIAGNOSIS — N951 Menopausal and female climacteric states: Secondary | ICD-10-CM | POA: Diagnosis not present
